# Patient Record
Sex: Female | Born: 1980 | Race: White | Hispanic: No | Smoking: Current every day smoker
Health system: Southern US, Community
[De-identification: ages and names within clinical notes are randomized; demographics above are authoritative.]

## PROBLEM LIST (undated history)

## (undated) DIAGNOSIS — I10 Essential (primary) hypertension: Secondary | ICD-10-CM

## (undated) DIAGNOSIS — E7800 Pure hypercholesterolemia, unspecified: Secondary | ICD-10-CM

## (undated) DIAGNOSIS — F4310 Post-traumatic stress disorder, unspecified: Secondary | ICD-10-CM

## (undated) DIAGNOSIS — E119 Type 2 diabetes mellitus without complications: Secondary | ICD-10-CM

## (undated) DIAGNOSIS — J302 Other seasonal allergic rhinitis: Secondary | ICD-10-CM

## (undated) DIAGNOSIS — J45909 Unspecified asthma, uncomplicated: Secondary | ICD-10-CM

## (undated) DIAGNOSIS — E079 Disorder of thyroid, unspecified: Secondary | ICD-10-CM

## (undated) HISTORY — PX: CHOLECYSTECTOMY: SHX55

## (undated) HISTORY — PX: ABDOMINAL HYSTERECTOMY: SHX81

## (undated) HISTORY — PX: THYROIDECTOMY: SHX17

---

## 2016-09-01 ENCOUNTER — Encounter (HOSPITAL_BASED_OUTPATIENT_CLINIC_OR_DEPARTMENT_OTHER): Payer: Self-pay | Admitting: Emergency Medicine

## 2016-09-01 ENCOUNTER — Emergency Department (HOSPITAL_BASED_OUTPATIENT_CLINIC_OR_DEPARTMENT_OTHER): Payer: Self-pay

## 2016-09-01 ENCOUNTER — Emergency Department (HOSPITAL_BASED_OUTPATIENT_CLINIC_OR_DEPARTMENT_OTHER)
Admission: EM | Admit: 2016-09-01 | Discharge: 2016-09-01 | Disposition: A | Discharge status: Home / Self Care | Payer: Self-pay | Attending: Emergency Medicine | Admitting: Emergency Medicine

## 2016-09-01 DIAGNOSIS — Z7984 Long term (current) use of oral hypoglycemic drugs: Secondary | ICD-10-CM | POA: Insufficient documentation

## 2016-09-01 DIAGNOSIS — R69 Illness, unspecified: Secondary | ICD-10-CM

## 2016-09-01 DIAGNOSIS — E119 Type 2 diabetes mellitus without complications: Secondary | ICD-10-CM | POA: Insufficient documentation

## 2016-09-01 DIAGNOSIS — F17200 Nicotine dependence, unspecified, uncomplicated: Secondary | ICD-10-CM | POA: Insufficient documentation

## 2016-09-01 DIAGNOSIS — J111 Influenza due to unidentified influenza virus with other respiratory manifestations: Secondary | ICD-10-CM | POA: Insufficient documentation

## 2016-09-01 DIAGNOSIS — Z79899 Other long term (current) drug therapy: Secondary | ICD-10-CM | POA: Insufficient documentation

## 2016-09-01 DIAGNOSIS — I10 Essential (primary) hypertension: Secondary | ICD-10-CM | POA: Insufficient documentation

## 2016-09-01 HISTORY — DX: Pure hypercholesterolemia, unspecified: E78.00

## 2016-09-01 HISTORY — DX: Other seasonal allergic rhinitis: J30.2

## 2016-09-01 HISTORY — DX: Post-traumatic stress disorder, unspecified: F43.10

## 2016-09-01 HISTORY — DX: Type 2 diabetes mellitus without complications: E11.9

## 2016-09-01 HISTORY — DX: Disorder of thyroid, unspecified: E07.9

## 2016-09-01 HISTORY — DX: Essential (primary) hypertension: I10

## 2016-09-01 LAB — URINALYSIS, ROUTINE W REFLEX MICROSCOPIC
Bilirubin Urine: NEGATIVE
Glucose, UA: NEGATIVE mg/dL
Hgb urine dipstick: NEGATIVE
Ketones, ur: NEGATIVE mg/dL
LEUKOCYTES UA: NEGATIVE
Nitrite: NEGATIVE
PROTEIN: NEGATIVE mg/dL
Specific Gravity, Urine: 1.014 (ref 1.005–1.030)
pH: 7 (ref 5.0–8.0)

## 2016-09-01 MED ORDER — ACETAMINOPHEN 500 MG PO TABS
1000.0000 mg | ORAL_TABLET | Freq: Once | ORAL | Status: AC
  Administered 2016-09-01: 1000 mg via ORAL
  Filled 2016-09-01: qty 2

## 2016-09-01 NOTE — Discharge Instructions (Signed)
Make sure that you drink at least six 8 ounce glasses of water each day. Get plenty of rest. Take Tylenol as directed for aches. See your primary care physician if not feeling better by next week

## 2016-09-01 NOTE — ED Triage Notes (Signed)
Patient states that she has a "small cough" that started last night. Reports that she now has generalized aches and lower back pain. Reports that she is "congested and stuffy"

## 2016-09-01 NOTE — ED Provider Notes (Signed)
[facility]   CSN: [id number] Arrival date & time: 09/01/16  2041  By signing my name below, I, Melissa Rollins, attest that this documentation has been prepared under the direction and in the presence of Doug SouSam Abbagail Scaff, MD. Electronically signed, Melissa Rollins, ED Scribe. 09/01/16. 10:36 PM.   History   Chief Complaint Chief Complaint  Patient presents with  . Fever  The history is provided by the patient and the spouse. No language interpreter was used.   HPI Comments: Melissa Rollins is a 35 y.o. female who presents to the Emergency Department complaining of generalized, constant, moderate body aches onset last night. She reports associated dry cough, lower back pain and nausea. Pt reports that her body aches are exacerbated with movement. The pt reports that tylenol provides mild relief for the pain, and she took her last dosage at 2:00PM today.The pt denies fever, dysuria, vomiting or sore throat.  She further reports a PMHx of DM and HLD. This pt has a past surgical history of a thyroidectomy and hysterectomy.   Past Medical History:  Diagnosis Date  . Diabetes mellitus without complication (HCC)   . Hypercholesterolemia   . Hypertension   . PTSD (post-traumatic stress disorder)   . Seasonal allergies   . Thyroid disease     There are no active problems to display for this patient.   Past Surgical History:  Procedure Laterality Date  . ABDOMINAL HYSTERECTOMY    . CHOLECYSTECTOMY    . THYROIDECTOMY      OB History    No data available       Home Medications    Prior to Admission medications   Medication Sig Start Date End Date Taking? Authorizing Provider  atorvastatin (LIPITOR) 20 MG tablet Take 20 mg by mouth daily.   Yes Historical Provider, MD  cetirizine (ZYRTEC) 10 MG tablet Take 10 mg by mouth daily.   Yes Historical Provider, MD  Fluoxetine HCl, PMDD, (SARAFEM) 10 MG TABS Take by mouth.   Yes Historical Provider, MD    hydrochlorothiazide (MICROZIDE) 12.5 MG capsule Take 12.5 mg by mouth daily.   Yes Historical Provider, MD  levothyroxine (SYNTHROID, LEVOTHROID) 200 MCG tablet Take 200 mcg by mouth daily before breakfast.   Yes Historical Provider, MD  lisinopril (PRINIVIL,ZESTRIL) 10 MG tablet Take 10 mg by mouth daily.   Yes Historical Provider, MD  meloxicam (MOBIC) 7.5 MG tablet Take 7.5 mg by mouth daily.   Yes Historical Provider, MD  metFORMIN (GLUCOPHAGE) 500 MG tablet Take by mouth 2 (two) times daily with a meal.   Yes Historical Provider, MD  traZODone (DESYREL) 100 MG tablet Take 100 mg by mouth at bedtime.   Yes Historical Provider, MD    Family History History reviewed. No pertinent family history.  Social History Social History  Substance Use Topics  . Smoking status: Current Every Day Smoker  . Smokeless tobacco: Never Used  . Alcohol use No     Allergies   Phenergan [promethazine hcl] and Motrin [ibuprofen]   Review of Systems Review of Systems  Constitutional: Negative for fever.  HENT: Negative.  Negative for sore throat.   Respiratory: Positive for cough.   Cardiovascular: Negative.   Gastrointestinal: Positive for nausea. Negative for vomiting.  Genitourinary: Negative for dysuria.  Musculoskeletal: Positive for back pain and myalgias (generalized).  Skin: Negative.   Neurological: Negative.   Psychiatric/Behavioral: Negative.      Physical Exam Updated Vital Signs BP 130/85 (BP Location: Left  Wrist)   Pulse 93   Temp 98.1 F (36.7 C) (Oral)   Resp 18   Ht 5\' 7"  (1.702 m)   Wt 295 lb (133.8 kg)   SpO2 98%   BMI 46.20 kg/m   Physical Exam  Constitutional: She appears well-developed and well-nourished.  HENT:  Head: Normocephalic and atraumatic.  Eyes: Conjunctivae are normal. Pupils are equal, round, and reactive to light.  Neck: Neck supple. No tracheal deviation present. No thyromegaly present.  Cardiovascular: Normal rate and regular rhythm.   No  murmur heard. Pulmonary/Chest: Effort normal and breath sounds normal.  Abdominal: Soft. Bowel sounds are normal. She exhibits no distension. There is no tenderness.  Musculoskeletal: Normal range of motion. She exhibits no edema or tenderness.  Neurological: She is alert. Coordination normal.  Skin: Skin is warm and dry. No rash noted.  Psychiatric: She has a normal mood and affect.  Nursing note and vitals reviewed.    ED Treatments / Results  DIAGNOSTIC STUDIES: Oxygen Saturation is 98% on RA, normal by my interpretation.    COORDINATION OF CARE: 10:36 PM Will order CXR, UA. Discussed treatment plan with pt at bedside and pt agreed to plan.    Labs (all labs ordered are listed, but only abnormal results are displayed) Labs Reviewed  URINALYSIS, ROUTINE W REFLEX MICROSCOPIC (NOT AT [facility])    EKG  EKG Interpretation None       Radiology Dg Chest 2 View  Result Date: 09/01/2016 CLINICAL DATA:  Body aches, chills and cough. EXAM: CHEST  2 VIEW COMPARISON:  None. FINDINGS: The heart size and mediastinal contours are within normal limits. Both lungs are clear. The visualized skeletal structures are unremarkable. IMPRESSION: No active cardiopulmonary disease. Electronically Signed   By: Ted Mcalpine M.D.   On: 09/01/2016 21:53    Procedures Procedures (including critical care time)  Medications Ordered in ED Medications - No data to display   Initial Impression / Assessment and Plan / ED Course  I have reviewed the triage vital signs and the nursing notes.  Pertinent labs & imaging results that were available during my care of the patient were reviewed by me and considered in my medical decision making (see chart for details).  Clinical Course    Chest x-ray reviewed by me Results for orders placed or performed during the hospital encounter of 09/01/16  Urinalysis, Routine w reflex microscopic (not at [facility])  Result Value Ref Range   Color, Urine YELLOW  YELLOW   APPearance CLEAR CLEAR   Specific Gravity, Urine 1.014 1.005 - 1.030   pH 7.0 5.0 - 8.0   Glucose, UA NEGATIVE NEGATIVE mg/dL   Hgb urine dipstick NEGATIVE NEGATIVE   Bilirubin Urine NEGATIVE NEGATIVE   Ketones, ur NEGATIVE NEGATIVE mg/dL   Protein, ur NEGATIVE NEGATIVE mg/dL   Nitrite NEGATIVE NEGATIVE   Leukocytes, UA NEGATIVE NEGATIVE   Dg Chest 2 View  Result Date: 09/01/2016 CLINICAL DATA:  Body aches, chills and cough. EXAM: CHEST  2 VIEW COMPARISON:  None. FINDINGS: The heart size and mediastinal contours are within normal limits. Both lungs are clear. The visualized skeletal structures are unremarkable. IMPRESSION: No active cardiopulmonary disease. Electronically Signed   By: Ted Mcalpine M.D.   On: 09/01/2016 21:53  Chest x-ray reviewed by me. Plan Tylenol for aches full with PMD as needed. Encourage oral hydration Final Clinical Impressions(s) / ED Diagnoses  Diagnosis influenza-like illness Final diagnoses:  None    New Prescriptions New Prescriptions  No medications on file     I personally performed the services described in this documentation, which was scribed in my presence. The recorded information has been reviewed and considered.     Doug Sou, MD 09/03/16 [id number]

## 2017-06-03 ENCOUNTER — Emergency Department (HOSPITAL_BASED_OUTPATIENT_CLINIC_OR_DEPARTMENT_OTHER)
Admission: EM | Admit: 2017-06-03 | Discharge: 2017-06-03 | Disposition: A | Discharge status: Home / Self Care | Payer: Self-pay | Attending: Emergency Medicine | Admitting: Emergency Medicine

## 2017-06-03 ENCOUNTER — Emergency Department (HOSPITAL_BASED_OUTPATIENT_CLINIC_OR_DEPARTMENT_OTHER): Payer: Self-pay

## 2017-06-03 ENCOUNTER — Encounter (HOSPITAL_BASED_OUTPATIENT_CLINIC_OR_DEPARTMENT_OTHER): Payer: Self-pay | Admitting: Emergency Medicine

## 2017-06-03 DIAGNOSIS — R0781 Pleurodynia: Secondary | ICD-10-CM

## 2017-06-03 DIAGNOSIS — J209 Acute bronchitis, unspecified: Secondary | ICD-10-CM | POA: Insufficient documentation

## 2017-06-03 DIAGNOSIS — I10 Essential (primary) hypertension: Secondary | ICD-10-CM | POA: Insufficient documentation

## 2017-06-03 DIAGNOSIS — Z7984 Long term (current) use of oral hypoglycemic drugs: Secondary | ICD-10-CM | POA: Insufficient documentation

## 2017-06-03 DIAGNOSIS — E119 Type 2 diabetes mellitus without complications: Secondary | ICD-10-CM | POA: Insufficient documentation

## 2017-06-03 DIAGNOSIS — Z79899 Other long term (current) drug therapy: Secondary | ICD-10-CM | POA: Insufficient documentation

## 2017-06-03 DIAGNOSIS — F17200 Nicotine dependence, unspecified, uncomplicated: Secondary | ICD-10-CM | POA: Insufficient documentation

## 2017-06-03 DIAGNOSIS — J40 Bronchitis, not specified as acute or chronic: Secondary | ICD-10-CM

## 2017-06-03 MED ORDER — ALBUTEROL SULFATE (2.5 MG/3ML) 0.083% IN NEBU
2.5000 mg | INHALATION_SOLUTION | Freq: Once | RESPIRATORY_TRACT | Status: AC
  Administered 2017-06-03: 2.5 mg via RESPIRATORY_TRACT
  Filled 2017-06-03: qty 3

## 2017-06-03 MED ORDER — HYDROCODONE-ACETAMINOPHEN 5-325 MG PO TABS
1.0000 | ORAL_TABLET | Freq: Once | ORAL | Status: AC
  Administered 2017-06-03: 1 via ORAL
  Filled 2017-06-03: qty 1

## 2017-06-03 MED ORDER — IPRATROPIUM-ALBUTEROL 0.5-2.5 (3) MG/3ML IN SOLN
3.0000 mL | Freq: Four times a day (QID) | RESPIRATORY_TRACT | Status: DC
  Administered 2017-06-03: 3 mL via RESPIRATORY_TRACT
  Filled 2017-06-03: qty 3

## 2017-06-03 MED ORDER — HYDROCODONE-HOMATROPINE 5-1.5 MG/5ML PO SYRP: 5.0000 mL | ORAL_SOLUTION | Freq: Four times a day (QID) | ORAL | 0 refills | Status: DC | PRN

## 2017-06-03 NOTE — Discharge Instructions (Signed)
Your xray did not show a definite rib fracture. It is possible that small fractures do not show on Xray initially. Continue all regular treatments given by your doctor. Use inhaler with spacer every 4hrs. Take pain/cough medication as prescribed. Use incentive spirometer every 1-2hrs. Follow up with family doctor in 2-3 days for recheck. Return if worsening symptoms.

## 2017-06-03 NOTE — ED Provider Notes (Signed)
[facility]   CSN: [id number] Arrival date & time: 06/03/17  1958  By signing my name below, I, Linna DarnerRussell Turner, attest that this documentation has been prepared under the direction and in the presence of Romario Tith, PA-C. Electronically Signed: Linna Darnerussell Turner, Scribe. 06/03/2017. 9:09 PM.  History   Chief Complaint Chief Complaint  Patient presents with  . Rib Injury   The history is provided by the patient. No language interpreter was used.    HPI Comments: Melissa Rollins is a 36 y.o. female with PMHx including DM and HTN who presents to the Emergency Department complaining of sudden onset, constant, severe left rib pain beginning around 6 PM this evening. Patient has had a persistent cough for several days and was diagnosed with bronchitis at her PCP's office on 7/5. Patient states she had a coughing spell this evening and heard a popping sensation in her left ribs accompanied by immediate pain. She was discharged from her PCP's office with Tessalon Perles, prednisone, and an albuterol inhaler which she has been taking without improvement of her cough. She did not take anything for her left rib pain prior to arrival. The pain is worse with palpation, coughing, and movement. She has no prior h/o left rib problems. Patient has a listed allergy to ibuprofen. She is compliant with her regular medications. Patient denies dyspnea, nausea, vomiting, or any other associated symptoms.  Past Medical History:  Diagnosis Date  . Diabetes mellitus without complication (HCC)   . Hypercholesterolemia   . Hypertension   . PTSD (post-traumatic stress disorder)   . Seasonal allergies   . Thyroid disease     There are no active problems to display for this patient.   Past Surgical History:  Procedure Laterality Date  . ABDOMINAL HYSTERECTOMY    . CHOLECYSTECTOMY    . THYROIDECTOMY      OB History    No data available       Home Medications    Prior to  Admission medications   Medication Sig Start Date End Date Taking? Authorizing Provider  gabapentin (NEURONTIN) 300 MG capsule Take 300 mg by mouth 3 (three) times daily.   Yes [provider]  predniSONE (DELTASONE) 10 MG tablet Take 40 mg by mouth daily with breakfast.   Yes [provider]  atorvastatin (LIPITOR) 20 MG tablet Take 20 mg by mouth daily.    [provider]  cetirizine (ZYRTEC) 10 MG tablet Take 10 mg by mouth daily.    [provider]  Fluoxetine HCl, PMDD, (SARAFEM) 10 MG TABS Take by mouth.    [provider]  hydrochlorothiazide (MICROZIDE) 12.5 MG capsule Take 12.5 mg by mouth daily.    [provider]  levothyroxine (SYNTHROID, LEVOTHROID) 200 MCG tablet Take 200 mcg by mouth daily before breakfast.    [provider]  lisinopril (PRINIVIL,ZESTRIL) 10 MG tablet Take 10 mg by mouth daily.    [provider]  meloxicam (MOBIC) 7.5 MG tablet Take 7.5 mg by mouth daily.    [provider]  metFORMIN (GLUCOPHAGE) 500 MG tablet Take by mouth 2 (two) times daily with a meal.    [provider]  traZODone (DESYREL) 100 MG tablet Take 100 mg by mouth at bedtime.    [provider]    Family History History reviewed. No pertinent family history.  Social History Social History  Substance Use Topics  . Smoking status: Current Every Day Smoker  . Smokeless tobacco: Never  Used  . Alcohol use No     Allergies   Phenergan [promethazine hcl]; Cefdinir; and Motrin [ibuprofen]   Review of Systems Review of Systems  Constitutional: Negative for chills and fever.  HENT: Positive for congestion and postnasal drip.   Respiratory: Positive for cough. Negative for shortness of breath.   Cardiovascular: Positive for chest pain (rib pain). Negative for leg swelling.  Gastrointestinal: Negative for nausea and vomiting.  All other systems reviewed and are negative.  Physical  Exam Updated Vital Signs BP 135/79 (BP Location: Left Arm)   Pulse (!) 104   Temp 98.6 F (37 C) (Oral)   Resp (!) 24   Ht 5\' 7"  (1.702 m)   Wt 299 lb (135.6 kg)   SpO2 96%   BMI 46.83 kg/m   Physical Exam  Constitutional: She is oriented to person, place, and time. She appears well-developed and well-nourished. No distress.  HENT:  Head: Normocephalic and atraumatic.  Eyes: Conjunctivae and EOM are normal.  Neck: Neck supple. No tracheal deviation present.  Cardiovascular: Normal rate, regular rhythm and normal heart sounds.   Pulmonary/Chest: Effort normal. No respiratory distress. She has wheezes. She has no rales. She exhibits tenderness.  End expiratory wheezes bilaterally. Tenderness to palpation over  the midaxillary and anterior lower ribs. No bruising, swelling, crepitus.  Musculoskeletal: Normal range of motion.  Neurological: She is alert and oriented to person, place, and time.  Skin: Skin is warm and dry.  Psychiatric: She has a normal mood and affect. Her behavior is normal.  Nursing note and vitals reviewed.  ED Treatments / Results  Labs (all labs ordered are listed, but only abnormal results are displayed) Labs Reviewed - No data to display  EKG  EKG Interpretation None       Radiology Dg Ribs Unilateral W/chest Left  Result Date: 06/03/2017 CLINICAL DATA:  Pain after coughing EXAM: LEFT RIBS AND CHEST - 3+ VIEW COMPARISON:  Chest radiograph September 01, 2016 FINDINGS: Frontal chest as well as oblique and cone-down lower rib images were obtained. Lungs are clear. Heart size and pulmonary vascularity are normal. No adenopathy. No evident pneumothorax or pleural effusion. No rib fractures. IMPRESSION: No evident rib fracture.  Lungs clear. Electronically Signed   By: Bretta Bang III M.D.   On: 06/03/2017 21:10    Procedures Procedures (including critical care time)  DIAGNOSTIC STUDIES: Oxygen Saturation is 96% on RA, adequate by my interpretation.     COORDINATION OF CARE: 9:02 PM Discussed treatment plan with pt at bedside and pt agreed to plan.  Medications Ordered in ED Medications  HYDROcodone-acetaminophen (NORCO/VICODIN) 5-325 MG per tablet 1 tablet (not administered)     Initial Impression / Assessment and Plan / ED Course  I have reviewed the triage vital signs and the nursing notes.  Pertinent labs & imaging results that were available during my care of the patient were reviewed by me and considered in my medical decision making (see chart for details).     Patient in emergency department with pain to the left ribs after coughing. States felt a pop, has been states he heard a pop as well. Chest x-ray is negative. Patient is coughing and wheezing on exam. Patient is not using her inhaler because "it doesn't help." Patient provided with a breathing treatment and a spacer. Also given incentive spirometer to use at home. Explained that occult hairline fracture may not show up right away. Patient's exam and imaging is difficult due to body habitus  as well. Patient does appear to be in severe pain, and even worse as she is coughing. I will treat her with Hycodan. She is otherwise afebrile, nontoxic-appearing. Oxygen saturation is normal. No respiratory distress.  Follow up with family doctor. Return precautions discussed.  Vitals:   06/03/17 2003 06/03/17 2006  BP:  135/79  Pulse:  (!) 104  Resp:  (!) 24  Temp:  98.6 F (37 C)  TempSrc:  Oral  SpO2:  96%  Weight: 135.6 kg (299 lb)   Height: 5\' 7"  (1.702 m)      Final Clinical Impressions(s) / ED Diagnoses   Final diagnoses:  Rib pain on left side  Bronchitis    New Prescriptions Current Discharge Medication List    START taking these medications   Details  HYDROcodone-homatropine (HYCODAN) 5-1.5 MG/5ML syrup Take 5 mLs by mouth every 6 (six) hours as needed for cough. Qty: 120 mL, Refills: 0       I personally performed the services described in this  documentation, which was scribed in my presence. The recorded information has been reviewed and is accurate.    Jaynie Crumble, PA-C 06/03/17 2148    Jacalyn Lefevre, MD 06/03/17 2208

## 2017-06-03 NOTE — ED Triage Notes (Signed)
Patient reports that she has had a cough and was treated at her drs for Bronchitis. She coughed earlier and felt her left ribs pop.

## 2018-03-31 ENCOUNTER — Encounter (HOSPITAL_BASED_OUTPATIENT_CLINIC_OR_DEPARTMENT_OTHER): Payer: Self-pay | Admitting: *Deleted

## 2018-03-31 ENCOUNTER — Other Ambulatory Visit: Payer: Self-pay

## 2018-03-31 ENCOUNTER — Emergency Department (HOSPITAL_BASED_OUTPATIENT_CLINIC_OR_DEPARTMENT_OTHER): Payer: Self-pay

## 2018-03-31 DIAGNOSIS — Z79899 Other long term (current) drug therapy: Secondary | ICD-10-CM | POA: Insufficient documentation

## 2018-03-31 DIAGNOSIS — R2243 Localized swelling, mass and lump, lower limb, bilateral: Secondary | ICD-10-CM | POA: Insufficient documentation

## 2018-03-31 DIAGNOSIS — R0781 Pleurodynia: Secondary | ICD-10-CM | POA: Insufficient documentation

## 2018-03-31 DIAGNOSIS — E119 Type 2 diabetes mellitus without complications: Secondary | ICD-10-CM | POA: Insufficient documentation

## 2018-03-31 DIAGNOSIS — I10 Essential (primary) hypertension: Secondary | ICD-10-CM | POA: Insufficient documentation

## 2018-03-31 DIAGNOSIS — M549 Dorsalgia, unspecified: Secondary | ICD-10-CM | POA: Insufficient documentation

## 2018-03-31 DIAGNOSIS — Z7984 Long term (current) use of oral hypoglycemic drugs: Secondary | ICD-10-CM | POA: Insufficient documentation

## 2018-03-31 DIAGNOSIS — M25512 Pain in left shoulder: Secondary | ICD-10-CM | POA: Insufficient documentation

## 2018-03-31 DIAGNOSIS — F17210 Nicotine dependence, cigarettes, uncomplicated: Secondary | ICD-10-CM | POA: Insufficient documentation

## 2018-03-31 DIAGNOSIS — J45909 Unspecified asthma, uncomplicated: Secondary | ICD-10-CM | POA: Insufficient documentation

## 2018-03-31 DIAGNOSIS — R05 Cough: Secondary | ICD-10-CM | POA: Insufficient documentation

## 2018-03-31 DIAGNOSIS — E079 Disorder of thyroid, unspecified: Secondary | ICD-10-CM | POA: Insufficient documentation

## 2018-03-31 NOTE — ED Triage Notes (Signed)
Pt reports productive cough x 4 days. C/o pain with cough and breathing in her back behind left shoulder last night. Pain worse with cough, deep breath and cough

## 2018-04-01 ENCOUNTER — Encounter (HOSPITAL_BASED_OUTPATIENT_CLINIC_OR_DEPARTMENT_OTHER): Payer: Self-pay

## 2018-04-01 ENCOUNTER — Emergency Department (HOSPITAL_BASED_OUTPATIENT_CLINIC_OR_DEPARTMENT_OTHER): Payer: Self-pay

## 2018-04-01 ENCOUNTER — Emergency Department (HOSPITAL_BASED_OUTPATIENT_CLINIC_OR_DEPARTMENT_OTHER)
Admission: EM | Admit: 2018-04-01 | Discharge: 2018-04-01 | Disposition: A | Discharge status: Home / Self Care | Payer: Self-pay | Attending: Emergency Medicine | Admitting: Emergency Medicine

## 2018-04-01 DIAGNOSIS — R059 Cough, unspecified: Secondary | ICD-10-CM

## 2018-04-01 DIAGNOSIS — R05 Cough: Secondary | ICD-10-CM

## 2018-04-01 DIAGNOSIS — R0781 Pleurodynia: Secondary | ICD-10-CM

## 2018-04-01 DIAGNOSIS — R600 Localized edema: Secondary | ICD-10-CM

## 2018-04-01 HISTORY — DX: Unspecified asthma, uncomplicated: J45.909

## 2018-04-01 LAB — CBC WITH DIFFERENTIAL/PLATELET
Basophils Absolute: 0 10*3/uL (ref 0.0–0.1)
Basophils Relative: 0 %
EOS ABS: 0.5 10*3/uL (ref 0.0–0.7)
EOS PCT: 4 %
HCT: 42.6 % (ref 36.0–46.0)
Hemoglobin: 14.8 g/dL (ref 12.0–15.0)
Lymphocytes Relative: 24 %
Lymphs Abs: 3 10*3/uL (ref 0.7–4.0)
MCH: 30.8 pg (ref 26.0–34.0)
MCHC: 34.7 g/dL (ref 30.0–36.0)
MCV: 88.8 fL (ref 78.0–100.0)
Monocytes Absolute: 0.8 10*3/uL (ref 0.1–1.0)
Monocytes Relative: 7 %
Neutro Abs: 8.1 10*3/uL (ref 1.7–7.7)
Neutrophils Relative %: 65 %
PLATELETS: 207 10*3/uL (ref 150–400)
RBC: 4.8 MIL/uL (ref 3.87–5.11)
RDW: 16.1 % — AB (ref 11.5–15.5)
WBC: 12.4 10*3/uL — AB (ref 4.0–10.5)

## 2018-04-01 LAB — BASIC METABOLIC PANEL
Anion gap: 10 (ref 5–15)
BUN: 8 mg/dL (ref 6–20)
CALCIUM: 9 mg/dL (ref 8.9–10.3)
CO2: 24 mmol/L (ref 22–32)
CREATININE: 0.72 mg/dL (ref 0.44–1.00)
Chloride: 102 mmol/L (ref 101–111)
Glucose, Bld: 199 mg/dL — ABNORMAL HIGH (ref 65–99)
Potassium: 3.3 mmol/L — ABNORMAL LOW (ref 3.5–5.1)
Sodium: 136 mmol/L (ref 135–145)

## 2018-04-01 LAB — BRAIN NATRIURETIC PEPTIDE: B NATRIURETIC PEPTIDE 5: 35.6 pg/mL (ref 0.0–100.0)

## 2018-04-01 LAB — D-DIMER, QUANTITATIVE (NOT AT ARMC): D DIMER QUANT: 0.52 ug{FEU}/mL — AB (ref 0.00–0.50)

## 2018-04-01 MED ORDER — FUROSEMIDE 20 MG PO TABS: 20.0000 mg | ORAL_TABLET | Freq: Every day | ORAL | 0 refills | Status: DC

## 2018-04-01 MED ORDER — IOPAMIDOL (ISOVUE-370) INJECTION 76%
100.0000 mL | Freq: Once | INTRAVENOUS | Status: AC | PRN
  Administered 2018-04-01: 100 mL via INTRAVENOUS

## 2018-04-01 MED ORDER — OXYCODONE-ACETAMINOPHEN 5-325 MG PO TABS
1.0000 | ORAL_TABLET | Freq: Once | ORAL | Status: AC
  Administered 2018-04-01: 1 via ORAL
  Filled 2018-04-01: qty 1

## 2018-04-01 MED ORDER — NAPROXEN 500 MG PO TABS: 500.0000 mg | ORAL_TABLET | Freq: Two times a day (BID) | ORAL | 0 refills | Status: DC

## 2018-04-01 NOTE — ED Provider Notes (Signed)
[facility] EMERGENCY DEPARTMENT Provider Note   CSN: [id number] Arrival date & time: 03/31/18  2252     History   Chief Complaint Chief Complaint  Patient presents with  . Cough    HPI Melissa Rollins is a 37 y.o. female.  HPI  This is a 37 year old female with a history of asthma, diabetes, hypertension who presents with cough and left sided back and shoulder pain.  Patient reports onset of symptoms 4 days ago.  Denies fevers or productive cough.  Reports that she has sharp left-sided back pain that is worse with coughing and breathing.  She took Flexeril and ibuprofen without any relief.  Rates her pain at 8 out of 10.  She has noted some increased lower extremity swelling.  She has been out of her Lasix for 1 week.  Denies any injury or falls.  Denies chest pain or shortness of breath.  Past Medical History:  Diagnosis Date  . Asthma   . Diabetes mellitus without complication (HCC)   . Hypercholesterolemia   . Hypertension   . PTSD (post-traumatic stress disorder)   . Seasonal allergies   . Thyroid disease     There are no active problems to display for this patient.   Past Surgical History:  Procedure Laterality Date  . ABDOMINAL HYSTERECTOMY    . CHOLECYSTECTOMY    . THYROIDECTOMY       OB History   None      Home Medications    Prior to Admission medications   Medication Sig Start Date End Date Taking? Authorizing Provider  Omega-3 Fatty Acids (FISH OIL) 1000 MG CPDR Take 2 capsules by mouth daily.   Yes [provider]  atorvastatin (LIPITOR) 20 MG tablet Take 20 mg by mouth daily.    [provider]  cetirizine (ZYRTEC) 10 MG tablet Take 10 mg by mouth daily.    [provider]  Fluoxetine HCl, PMDD, (SARAFEM) 10 MG TABS Take by mouth.    [provider]  furosemide (LASIX) 20 MG tablet Take 1 tablet (20 mg total) by mouth daily. 04/01/18   Horton, Mayer Masker, MD  gabapentin (NEURONTIN) 300 MG capsule Take 300  mg by mouth 3 (three) times daily.    [provider]  hydrochlorothiazide (MICROZIDE) 12.5 MG capsule Take 12.5 mg by mouth daily.    [provider]  HYDROcodone-homatropine (HYCODAN) 5-1.5 MG/5ML syrup Take 5 mLs by mouth every 6 (six) hours as needed for cough. 06/03/17   Kirichenko, Lemont Fillers, PA-C  levothyroxine (SYNTHROID, LEVOTHROID) 200 MCG tablet Take 200 mcg by mouth daily before breakfast.    [provider]  lisinopril (PRINIVIL,ZESTRIL) 10 MG tablet Take 10 mg by mouth daily.    [provider]  meloxicam (MOBIC) 7.5 MG tablet Take 7.5 mg by mouth daily.    [provider]  metFORMIN (GLUCOPHAGE) 500 MG tablet Take by mouth 2 (two) times daily with a meal.    [provider]  naproxen (NAPROSYN) 500 MG tablet Take 1 tablet (500 mg total) by mouth 2 (two) times daily. 04/01/18   Horton, Mayer Masker, MD  predniSONE (DELTASONE) 10 MG tablet Take 40 mg by mouth daily with breakfast.    [provider]  traZODone (DESYREL) 100 MG tablet Take 100 mg by mouth at bedtime.    [provider]    Family History No family history on file.  Social History Social History   Tobacco Use  . Smoking status: Current  Every Day Smoker    Types: Cigarettes  . Smokeless tobacco: Never Used  Substance Use Topics  . Alcohol use: No  . Drug use: No     Allergies   Phenergan [promethazine hcl]; Cefdinir; and Motrin [ibuprofen]   Review of Systems Review of Systems  Constitutional: Negative for fever.  Respiratory: Positive for cough. Negative for shortness of breath.   Cardiovascular: Negative for chest pain.  Gastrointestinal: Negative for abdominal pain, nausea and vomiting.  Genitourinary: Negative for dysuria.  Musculoskeletal:       Left posterior shoulder pain  All other systems reviewed and are negative.    Physical Exam Updated Vital Signs BP 115/74 (BP Location: Right Arm)   Pulse 74   Temp 98 F (36.7 C)  (Oral)   Resp 16   SpO2 95%   Physical Exam  Constitutional: She is oriented to person, place, and time. She appears well-developed and well-nourished.  Morbidly obese, no acute distress  HENT:  Head: Normocephalic and atraumatic.  Eyes: Pupils are equal, round, and reactive to light.  Cardiovascular: Normal rate, regular rhythm and normal heart sounds.  No murmur heard. Pulmonary/Chest: Effort normal and breath sounds normal. No respiratory distress. She has no wheezes.  Abdominal: Soft. Bowel sounds are normal. There is no tenderness.  Musculoskeletal:  Tenderness palpation left posterior shoulder at the scapula, no overlying skin changes 2+ pitting edema bilateral lower extremities  Neurological: She is alert and oriented to person, place, and time.  Skin: Skin is warm and dry.  Psychiatric: She has a normal mood and affect.  Nursing note and vitals reviewed.    ED Treatments / Results  Labs (all labs ordered are listed, but only abnormal results are displayed) Labs Reviewed  CBC WITH DIFFERENTIAL/PLATELET - Abnormal; Notable for the following components:      Result Value   WBC 12.4 (*)    RDW 16.1 (*)    All other components within normal limits  BASIC METABOLIC PANEL - Abnormal; Notable for the following components:   Potassium 3.3 (*)    Glucose, Bld 199 (*)    All other components within normal limits  D-DIMER, QUANTITATIVE (NOT AT [facility]) - Abnormal; Notable for the following components:   D-Dimer, Quant 0.52 (*)    All other components within normal limits  BRAIN NATRIURETIC PEPTIDE    EKG None  Radiology Dg Chest 2 View  Result Date: 04/01/2018 CLINICAL DATA:  Left scapular pain since yesterday. EXAM: CHEST - 2 VIEW COMPARISON:  06/03/2017 FINDINGS: Borderline cardiomegaly with mild central vascular congestion. No acute pulmonary consolidation or pneumothorax. No effusion. No acute osseous abnormality. IMPRESSION: Borderline cardiomegaly with mild central  vascular congestion. Electronically Signed   By: Tollie Eth M.D.   On: 04/01/2018 00:14   Ct Angio Chest Pe W And/or Wo Contrast  Result Date: 04/01/2018 CLINICAL DATA:  37 year old female seen with elevated D-dimer. EXAM: CT ANGIOGRAPHY CHEST WITH CONTRAST TECHNIQUE: Multidetector CT imaging of the chest was performed using the standard protocol during bolus administration of intravenous contrast. Multiplanar CT image reconstructions and MIPs were obtained to evaluate the vascular anatomy. CONTRAST:  ISOVUE-370 IOPAMIDOL (ISOVUE-370) INJECTION 76% COMPARISON:  Chest radiograph dated 03/31/2018 FINDINGS: Cardiovascular: Top-normal cardiac size. No pericardial effusion. The thoracic aorta is unremarkable. The origins of the great vessels of the aortic arch are patent. There is no CT evidence of pulmonary embolism. Mediastinum/Nodes: No hilar or mediastinal adenopathy. The esophagus is grossly unremarkable. No mediastinal fluid collection. Lungs/Pleura: Minimal  atelectatic changes. There is no focal consolidation, pleural effusion, or pneumothorax. The central airways are patent. Upper Abdomen: Fatty liver. Cholecystectomy. A 2 cm left adrenal adenoma. Musculoskeletal: No chest wall abnormality. No acute or significant osseous findings. Review of the MIP images confirms the above findings. IMPRESSION: No acute intrathoracic pathology. No CT evidence of pulmonary embolism. Electronically Signed   By: Elgie Collard M.D.   On: 04/01/2018 04:19    Procedures Procedures (including critical care time)  Medications Ordered in ED Medications  oxyCODONE-acetaminophen (PERCOCET/ROXICET) 5-325 MG per tablet 1 tablet (1 tablet Oral Given 04/01/18 0152)  iopamidol (ISOVUE-370) 76 % injection 100 mL (100 mLs Intravenous Contrast Given 04/01/18 0351)     Initial Impression / Assessment and Plan / ED Course  I have reviewed the triage vital signs and the nursing notes.  Pertinent labs & imaging results that  were available during my care of the patient were reviewed by me and considered in my medical decision making (see chart for details).     Patient presents with cough and pleuritic pain in the back just adjacent to the scapula.  She is obese but nontoxic-appearing.  No respiratory distress.  Vital signs are reassuring.  She also has some lower extremity swelling.  Considerations include volume overload, pneumonia, PE, musculoskeletal pain.  Patient was given pain medication.  Initial work-up notable for mild leukocytosis to 12.4.  Chest x-ray shows evidence of some volume overload.  D-dimer is mildly elevated at 0.52.  CT scan obtained and reassuring.  Suspect musculoskeletal etiology for discomfort.  Patient will be started back on her Lasix as she appears volume overloaded.  She will need to follow-up with her primary physician.  After history, exam, and medical workup I feel the patient has been appropriately medically screened and is safe for discharge home. Pertinent diagnoses were discussed with the patient. Patient was given return precautions.  Final Clinical Impressions(s) / ED Diagnoses   Final diagnoses:  Cough  Pleuritic pain  Bilateral lower extremity edema    ED Discharge Orders        Ordered    furosemide (LASIX) 20 MG tablet  Daily     04/01/18 0430    naproxen (NAPROSYN) 500 MG tablet  2 times daily     04/01/18 0430       Horton, Mayer Masker, MD 04/01/18 [phone]

## 2018-04-01 NOTE — Discharge Instructions (Addendum)
You were seen today for cough, left shoulder pain, and increased lower extremity edema.  Your CT scan does not show any evidence of pulmonary embolism.  You do appear slightly volume overloaded.  You will be given a refill of your Lasix.  Start taking as soon as possible.  Take naproxen as needed for pain.

## 2018-04-01 NOTE — ED Notes (Signed)
Patient transported to CT 

## 2018-04-01 NOTE — ED Notes (Signed)
Patient returned from CT; no acute distress noted.

## 2018-05-20 ENCOUNTER — Other Ambulatory Visit: Payer: Self-pay

## 2018-05-20 ENCOUNTER — Encounter (HOSPITAL_BASED_OUTPATIENT_CLINIC_OR_DEPARTMENT_OTHER): Payer: Self-pay | Admitting: Emergency Medicine

## 2018-05-20 ENCOUNTER — Emergency Department (HOSPITAL_BASED_OUTPATIENT_CLINIC_OR_DEPARTMENT_OTHER)
Admission: EM | Admit: 2018-05-20 | Discharge: 2018-05-20 | Disposition: A | Discharge status: Home / Self Care | Payer: Self-pay | Attending: Emergency Medicine | Admitting: Emergency Medicine

## 2018-05-20 DIAGNOSIS — E119 Type 2 diabetes mellitus without complications: Secondary | ICD-10-CM | POA: Insufficient documentation

## 2018-05-20 DIAGNOSIS — F17210 Nicotine dependence, cigarettes, uncomplicated: Secondary | ICD-10-CM | POA: Insufficient documentation

## 2018-05-20 DIAGNOSIS — J45909 Unspecified asthma, uncomplicated: Secondary | ICD-10-CM | POA: Insufficient documentation

## 2018-05-20 DIAGNOSIS — Z7984 Long term (current) use of oral hypoglycemic drugs: Secondary | ICD-10-CM | POA: Insufficient documentation

## 2018-05-20 DIAGNOSIS — Z79899 Other long term (current) drug therapy: Secondary | ICD-10-CM | POA: Insufficient documentation

## 2018-05-20 DIAGNOSIS — I10 Essential (primary) hypertension: Secondary | ICD-10-CM | POA: Insufficient documentation

## 2018-05-20 DIAGNOSIS — M25511 Pain in right shoulder: Secondary | ICD-10-CM | POA: Insufficient documentation

## 2018-05-20 DIAGNOSIS — G8929 Other chronic pain: Secondary | ICD-10-CM | POA: Insufficient documentation

## 2018-05-20 MED ORDER — HYDROCODONE-ACETAMINOPHEN 5-325 MG PO TABS
1.0000 | ORAL_TABLET | Freq: Once | ORAL | Status: AC
  Administered 2018-05-20: 1 via ORAL
  Filled 2018-05-20: qty 1

## 2018-05-20 MED ORDER — HYDROCODONE-ACETAMINOPHEN 5-325 MG PO TABS: 1.0000 | ORAL_TABLET | Freq: Four times a day (QID) | ORAL | 0 refills | Status: DC | PRN

## 2018-05-20 NOTE — Discharge Instructions (Addendum)
You were seen today for shoulder pain.  This seems to be an acute on chronic issue for you.  There are some features that suggest nerve impingement; however, this also could be related to chronic arthritis or rotator cuff injury.  Follow-up with sports medicine for further evaluation.  You will be given a short course of pain medication.  Continue your Mobic as well.

## 2018-05-20 NOTE — ED Provider Notes (Signed)
[facility] EMERGENCY DEPARTMENT Provider Note   CSN: [id number] Arrival date & time: 05/20/18  0100     History   Chief Complaint Chief Complaint  Patient presents with  . Shoulder Pain    HPI Melissa Rollins is a 37 y.o. female.  HPI  This is a 37 year old female with history of obesity, diabetes, hypertension, hypercholesterolemia who presents with right shoulder pain.  Patient reports a history of the same.  She states she gets "flares."  She reports that normally she takes her Tylenol and muscle relaxant and "it will get better in a day."  However, she states she has had pain over the last 2 to 3 days with worsening pain.  She describes pain in the right upper shoulder neck area.  Worse with range of motion of the shoulder and neck turning.  She denies any new injury.  At times the pain radiates down her arm.  She reports that when she ranges her right shoulder it feels like "my bone is grinding."  She at times has tingling in her third and fourth digits of the right hand.  She denies any weakness of the hand.  Currently she rates her pain at 7 out of 10.  She also chronically takes Mobic but states that that does not seem to help.  She has not seen a specialist.  Past Medical History:  Diagnosis Date  . Asthma   . Diabetes mellitus without complication (HCC)   . Hypercholesterolemia   . Hypertension   . PTSD (post-traumatic stress disorder)   . Seasonal allergies   . Thyroid disease     There are no active problems to display for this patient.   Past Surgical History:  Procedure Laterality Date  . ABDOMINAL HYSTERECTOMY    . CHOLECYSTECTOMY    . THYROIDECTOMY       OB History   None      Home Medications    Prior to Admission medications   Medication Sig Start Date End Date Taking? Authorizing Provider  atorvastatin (LIPITOR) 20 MG tablet Take 20 mg by mouth daily.    [provider]  cetirizine (ZYRTEC) 10 MG tablet Take 10 mg by mouth  daily.    [provider]  Fluoxetine HCl, PMDD, (SARAFEM) 10 MG TABS Take by mouth.    [provider]  furosemide (LASIX) 20 MG tablet Take 1 tablet (20 mg total) by mouth daily. 04/01/18   Ebrahim Deremer, Mayer Masker, MD  gabapentin (NEURONTIN) 300 MG capsule Take 300 mg by mouth 3 (three) times daily.    [provider]  hydrochlorothiazide (MICROZIDE) 12.5 MG capsule Take 12.5 mg by mouth daily.    [provider]  HYDROcodone-acetaminophen (NORCO/VICODIN) 5-325 MG tablet Take 1 tablet by mouth every 6 (six) hours as needed. 05/20/18   Edger Husain, Mayer Masker, MD  HYDROcodone-homatropine (HYCODAN) 5-1.5 MG/5ML syrup Take 5 mLs by mouth every 6 (six) hours as needed for cough. 06/03/17   Kirichenko, Lemont Fillers, PA-C  levothyroxine (SYNTHROID, LEVOTHROID) 200 MCG tablet Take 200 mcg by mouth daily before breakfast.    [provider]  lisinopril (PRINIVIL,ZESTRIL) 10 MG tablet Take 10 mg by mouth daily.    [provider]  meloxicam (MOBIC) 7.5 MG tablet Take 7.5 mg by mouth daily.    [provider]  metFORMIN (GLUCOPHAGE) 500 MG tablet Take by mouth 2 (two) times daily with a meal.    [provider]  naproxen (NAPROSYN) 500 MG tablet Take 1  tablet (500 mg total) by mouth 2 (two) times daily. 04/01/18   Ladashia Demarinis, Mayer Masker, MD  Omega-3 Fatty Acids (FISH OIL) 1000 MG CPDR Take 2 capsules by mouth daily.    [provider]  predniSONE (DELTASONE) 10 MG tablet Take 40 mg by mouth daily with breakfast.    [provider]  traZODone (DESYREL) 100 MG tablet Take 100 mg by mouth at bedtime.    [provider]    Family History No family history on file.  Social History Social History   Tobacco Use  . Smoking status: Current Every Day Smoker    Types: Cigarettes  . Smokeless tobacco: Never Used  Substance Use Topics  . Alcohol use: No  . Drug use: No     Allergies   Phenergan [promethazine hcl]; Cefdinir; and  Motrin [ibuprofen]   Review of Systems Review of Systems  Constitutional: Negative for fever.  Musculoskeletal:       Right shoulder pain  Neurological: Negative for weakness and numbness.  All other systems reviewed and are negative.    Physical Exam Updated Vital Signs There were no vitals taken for this visit.  Physical Exam  Constitutional: She is oriented to person, place, and time. She appears well-developed and well-nourished.  HENT:  Head: Normocephalic and atraumatic.  Neck: Normal range of motion. Neck supple.  No midline C-spine tenderness palpation, step-off, or deformity  Cardiovascular: Normal rate and regular rhythm.  Pulmonary/Chest: Effort normal. No respiratory distress.  Musculoskeletal:  Focused examination of the right shoulder limited by body habitus, no obvious deformities or swelling, tenderness to palpation of the brachial plexus in the right AC joint, patient with normal abduction of the shoulder, pain with passive and active range of motion of the shoulder, normal range of motion of the elbow, external and internal rotation and strength of the shoulder intact  Neurological: She is alert and oriented to person, place, and time.  5 out of 5 grip, deltoid, biceps, and triceps strength bilaterally  Skin: Skin is warm and dry.  Psychiatric: She has a normal mood and affect.  Nursing note and vitals reviewed.    ED Treatments / Results  Labs (all labs ordered are listed, but only abnormal results are displayed) Labs Reviewed - No data to display  EKG None  Radiology No results found.  Procedures Procedures (including critical care time)  Medications Ordered in ED Medications  HYDROcodone-acetaminophen (NORCO/VICODIN) 5-325 MG per tablet 1 tablet (has no administration in time range)     Initial Impression / Assessment and Plan / ED Course  I have reviewed the triage vital signs and the nursing notes.  Pertinent labs & imaging results that  were available during my care of the patient were reviewed by me and considered in my medical decision making (see chart for details).     Patient presents with right shoulder pain.  Pain is acute on chronic.  She has pain with range of motion but preserved range of motion.  There are features of her pain that do suggest a radicular component and some of her pain is elicited with palpation of the brachial plexus.  She also reports some tingling in her fingers in the C7/C8 nerve distribution.  She has intact strength.  At this time I do not feel that imaging would be high yield as I doubt acute fracture.  Patient reports compliance with her home medications including Mobic and a muscle relaxant.  She is not a good candidate for  prednisone given her history of diabetes.  Patient will be given a short course of Norco.  The [place] database was reviewed without any recent prescriptions.  Last prescription was in December 2018.  Recommend close follow-up with sports medicine for focused evaluation.  Patient stated understanding.  After history, exam, and medical workup I feel the patient has been appropriately medically screened and is safe for discharge home. Pertinent diagnoses were discussed with the patient. Patient was given return precautions.   Final Clinical Impressions(s) / ED Diagnoses   Final diagnoses:  Chronic right shoulder pain    ED Discharge Orders        Ordered    HYDROcodone-acetaminophen (NORCO/VICODIN) 5-325 MG tablet  Every 6 hours PRN     05/20/18 0136       Shon BatonHorton, Ichiro Chesnut F, MD 05/20/18 [phone]

## 2018-05-20 NOTE — ED Triage Notes (Addendum)
C/o chronic R shoulder pain with intermittent "flare ups".  Current flare up started 2 days ago. Pt reports pain radiates to neck and elbow. Describes pain as "feels like someone is crushing a bone". Limited ROM. Pt reports having tried muscle relaxers at home without relief.

## 2018-07-11 ENCOUNTER — Emergency Department (HOSPITAL_BASED_OUTPATIENT_CLINIC_OR_DEPARTMENT_OTHER): Payer: Self-pay

## 2018-07-11 ENCOUNTER — Emergency Department (HOSPITAL_BASED_OUTPATIENT_CLINIC_OR_DEPARTMENT_OTHER)
Admission: EM | Admit: 2018-07-11 | Discharge: 2018-07-12 | Disposition: A | Discharge status: Home / Self Care | Payer: Self-pay | Attending: Emergency Medicine | Admitting: Emergency Medicine

## 2018-07-11 ENCOUNTER — Other Ambulatory Visit: Payer: Self-pay

## 2018-07-11 ENCOUNTER — Encounter (HOSPITAL_BASED_OUTPATIENT_CLINIC_OR_DEPARTMENT_OTHER): Payer: Self-pay

## 2018-07-11 DIAGNOSIS — Z79899 Other long term (current) drug therapy: Secondary | ICD-10-CM | POA: Insufficient documentation

## 2018-07-11 DIAGNOSIS — E119 Type 2 diabetes mellitus without complications: Secondary | ICD-10-CM | POA: Insufficient documentation

## 2018-07-11 DIAGNOSIS — K5732 Diverticulitis of large intestine without perforation or abscess without bleeding: Secondary | ICD-10-CM | POA: Insufficient documentation

## 2018-07-11 DIAGNOSIS — K5792 Diverticulitis of intestine, part unspecified, without perforation or abscess without bleeding: Secondary | ICD-10-CM

## 2018-07-11 DIAGNOSIS — R1032 Left lower quadrant pain: Secondary | ICD-10-CM

## 2018-07-11 DIAGNOSIS — Z7982 Long term (current) use of aspirin: Secondary | ICD-10-CM | POA: Insufficient documentation

## 2018-07-11 DIAGNOSIS — J45909 Unspecified asthma, uncomplicated: Secondary | ICD-10-CM | POA: Insufficient documentation

## 2018-07-11 DIAGNOSIS — I10 Essential (primary) hypertension: Secondary | ICD-10-CM | POA: Insufficient documentation

## 2018-07-11 DIAGNOSIS — F17210 Nicotine dependence, cigarettes, uncomplicated: Secondary | ICD-10-CM | POA: Insufficient documentation

## 2018-07-11 LAB — URINALYSIS, ROUTINE W REFLEX MICROSCOPIC
Bilirubin Urine: NEGATIVE
Glucose, UA: NEGATIVE mg/dL
HGB URINE DIPSTICK: NEGATIVE
Ketones, ur: NEGATIVE mg/dL
Leukocytes, UA: NEGATIVE
NITRITE: NEGATIVE
Protein, ur: NEGATIVE mg/dL
Specific Gravity, Urine: 1.02 (ref 1.005–1.030)
pH: 6 (ref 5.0–8.0)

## 2018-07-11 LAB — COMPREHENSIVE METABOLIC PANEL
ALBUMIN: 3.8 g/dL (ref 3.5–5.0)
ALK PHOS: 69 U/L (ref 38–126)
ALT: 17 U/L (ref 0–44)
AST: 28 U/L (ref 15–41)
Anion gap: 10 (ref 5–15)
BUN: 11 mg/dL (ref 6–20)
CO2: 24 mmol/L (ref 22–32)
CREATININE: 0.67 mg/dL (ref 0.44–1.00)
Calcium: 8.8 mg/dL — ABNORMAL LOW (ref 8.9–10.3)
Chloride: 101 mmol/L (ref 98–111)
GFR calc Af Amer: >60 mL/min (ref >60)
GFR calc non Af Amer: >60 mL/min (ref >60)
GLUCOSE: 162 mg/dL — AB (ref 70–99)
Potassium: 3.6 mmol/L (ref 3.5–5.1)
SODIUM: 135 mmol/L (ref 135–145)
Total Bilirubin: 0.4 mg/dL (ref 0.3–1.2)
Total Protein: 7.1 g/dL (ref 6.5–8.1)

## 2018-07-11 LAB — CBC
HCT: 42.7 % (ref 36.0–46.0)
Hemoglobin: 14.9 g/dL (ref 12.0–15.0)
MCH: 30.7 pg (ref 26.0–34.0)
MCHC: 34.9 g/dL (ref 30.0–36.0)
MCV: 87.9 fL (ref 78.0–100.0)
PLATELETS: 234 10*3/uL (ref 150–400)
RBC: 4.86 MIL/uL (ref 3.87–5.11)
RDW: 15.5 % (ref 11.5–15.5)
WBC: 14.2 10*3/uL — ABNORMAL HIGH (ref 4.0–10.5)

## 2018-07-11 LAB — LIPASE, BLOOD: Lipase: 38 U/L (ref 11–51)

## 2018-07-11 LAB — I-STAT CG4 LACTIC ACID, ED: LACTIC ACID, VENOUS: 1.68 mmol/L (ref 0.5–1.9)

## 2018-07-11 MED ORDER — MORPHINE SULFATE (PF) 4 MG/ML IV SOLN
4.0000 mg | Freq: Once | INTRAVENOUS | Status: AC
  Administered 2018-07-11: 4 mg via INTRAVENOUS
  Filled 2018-07-11: qty 1

## 2018-07-11 MED ORDER — IOPAMIDOL (ISOVUE-300) INJECTION 61%
125.0000 mL | Freq: Once | INTRAVENOUS | Status: AC | PRN
  Administered 2018-07-11: 125 mL via INTRAVENOUS

## 2018-07-11 MED ORDER — ONDANSETRON HCL 4 MG/2ML IJ SOLN
4.0000 mg | Freq: Once | INTRAMUSCULAR | Status: AC | PRN
  Administered 2018-07-11: 4 mg via INTRAVENOUS
  Filled 2018-07-11: qty 2

## 2018-07-11 MED ORDER — SODIUM CHLORIDE 0.9 % IV BOLUS
1000.0000 mL | Freq: Once | INTRAVENOUS | Status: AC
  Administered 2018-07-11: 1000 mL via INTRAVENOUS

## 2018-07-11 NOTE — ED Provider Notes (Signed)
[facility] EMERGENCY DEPARTMENT Provider Note   CSN: [id number] Arrival date & time: 07/11/18  2054     History   Chief Complaint Chief Complaint  Patient presents with  . Abdominal Pain    HPI Melissa Rollins is a 37 y.o. female.  The history is provided by the patient, a relative and medical records. No language interpreter was used.  Abdominal Pain   This is a new problem. The current episode started yesterday. The problem occurs constantly. The problem has been gradually worsening. The pain is associated with an unknown factor. The pain is located in the LUQ and LLQ. The quality of the pain is aching, sharp and cramping. The pain is at a severity of 10/10. The pain is severe. Associated symptoms include diarrhea and nausea. Pertinent negatives include fever, belching, vomiting, dysuria, frequency and headaches. The symptoms are aggravated by palpation. Nothing relieves the symptoms.    Past Medical History:  Diagnosis Date  . Asthma   . Diabetes mellitus without complication (HCC)   . Hypercholesterolemia   . Hypertension   . PTSD (post-traumatic stress disorder)   . Seasonal allergies   . Thyroid disease     There are no active problems to display for this patient.   Past Surgical History:  Procedure Laterality Date  . ABDOMINAL HYSTERECTOMY    . CHOLECYSTECTOMY    . THYROIDECTOMY       OB History   None      Home Medications    Prior to Admission medications   Medication Sig Start Date End Date Taking? Authorizing Provider  atorvastatin (LIPITOR) 20 MG tablet Take 20 mg by mouth daily.    [provider]  cetirizine (ZYRTEC) 10 MG tablet Take 10 mg by mouth daily.    [provider]  Fluoxetine HCl, PMDD, (SARAFEM) 10 MG TABS Take by mouth.    [provider]  furosemide (LASIX) 20 MG tablet Take 1 tablet (20 mg total) by mouth daily. 04/01/18   Horton, Mayer Masker, MD  gabapentin (NEURONTIN) 300 MG capsule Take 300 mg by  mouth 3 (three) times daily.    [provider]  hydrochlorothiazide (MICROZIDE) 12.5 MG capsule Take 12.5 mg by mouth daily.    [provider]  HYDROcodone-acetaminophen (NORCO/VICODIN) 5-325 MG tablet Take 1 tablet by mouth every 6 (six) hours as needed. 05/20/18   Horton, Mayer Masker, MD  HYDROcodone-homatropine (HYCODAN) 5-1.5 MG/5ML syrup Take 5 mLs by mouth every 6 (six) hours as needed for cough. 06/03/17   Kirichenko, Lemont Fillers, PA-C  levothyroxine (SYNTHROID, LEVOTHROID) 200 MCG tablet Take 200 mcg by mouth daily before breakfast.    [provider]  lisinopril (PRINIVIL,ZESTRIL) 10 MG tablet Take 10 mg by mouth daily.    [provider]  meloxicam (MOBIC) 7.5 MG tablet Take 7.5 mg by mouth daily.    [provider]  metFORMIN (GLUCOPHAGE) 500 MG tablet Take by mouth 2 (two) times daily with a meal.    [provider]  naproxen (NAPROSYN) 500 MG tablet Take 1 tablet (500 mg total) by mouth 2 (two) times daily. 04/01/18   Horton, Mayer Masker, MD  Omega-3 Fatty Acids (FISH OIL) 1000 MG CPDR Take 2 capsules by mouth daily.    [provider]  predniSONE (DELTASONE) 10 MG tablet Take 40 mg by mouth daily with breakfast.    [provider]  traZODone (DESYREL) 100 MG tablet Take 100 mg by mouth at bedtime.    [provider]    Family History No family history on file.  Social History Social History   Tobacco Use  . Smoking status: Current Every Day Smoker    Types: Cigarettes  . Smokeless tobacco: Never Used  Substance Use Topics  . Alcohol use: No  . Drug use: No     Allergies   Phenergan [promethazine hcl]; Cefdinir; and Motrin [ibuprofen]   Review of Systems Review of Systems  Constitutional: Negative for chills, diaphoresis, fatigue and fever.  HENT: Negative for congestion.   Eyes: Negative for visual disturbance.  Respiratory: Negative for cough, chest tightness and shortness of breath.     Cardiovascular: Negative for chest pain and palpitations.  Gastrointestinal: Positive for abdominal pain, diarrhea and nausea. Negative for abdominal distention and vomiting.  Genitourinary: Positive for flank pain. Negative for decreased urine volume, dysuria, frequency, pelvic pain, vaginal bleeding, vaginal discharge and vaginal pain.  Musculoskeletal: Positive for back pain. Negative for neck pain and neck stiffness.  Skin: Negative for rash and wound.  Neurological: Negative for light-headedness and headaches.  Psychiatric/Behavioral: Negative for agitation.  All other systems reviewed and are negative.    Physical Exam Updated Vital Signs BP (!) 170/110 (BP Location: Right Arm)   Pulse 97   Temp 98.1 F (36.7 C) (Oral)   Resp 19   Ht 5\' 7"  (1.702 m)   Wt (!) 146.5 kg   SpO2 96%   BMI 50.59 kg/m   Physical Exam  Constitutional: She appears well-developed and well-nourished. No distress.  HENT:  Head: Normocephalic and atraumatic.  Mouth/Throat: Oropharynx is clear and moist. No oropharyngeal exudate.  Eyes: Pupils are equal, round, and reactive to light. Conjunctivae and EOM are normal.  Neck: Neck supple.  Cardiovascular: Normal rate and regular rhythm.  No murmur heard. Pulmonary/Chest: Effort normal and breath sounds normal. No respiratory distress. She has no wheezes. She has no rales. She exhibits no tenderness.  Abdominal: Soft. Normal appearance and bowel sounds are normal. There is tenderness in the left upper quadrant and left lower quadrant. There is CVA tenderness. There is no rigidity, no rebound and no guarding. A hernia is present.    Musculoskeletal: She exhibits tenderness. She exhibits no edema.       Back:  Lymphadenopathy:    She has no cervical adenopathy.  Neurological: She is alert. No sensory deficit. She exhibits normal muscle tone.  Skin: Skin is warm and dry. Capillary refill takes less than 2 seconds. No rash noted. She is not diaphoretic.  No erythema.  Psychiatric: She has a normal mood and affect.  Nursing note and vitals reviewed.    ED Treatments / Results  Labs (all labs ordered are listed, but only abnormal results are displayed) Labs Reviewed  COMPREHENSIVE METABOLIC PANEL - Abnormal; Notable for the following components:      Result Value   Glucose, Bld 162 (*)    Calcium 8.8 (*)    All other components within normal limits  CBC - Abnormal; Notable for the following components:   WBC 14.2 (*)    All other components within normal limits  URINE CULTURE  URINALYSIS, ROUTINE W REFLEX MICROSCOPIC  LIPASE, BLOOD  I-STAT CG4 LACTIC ACID, ED  I-STAT CG4 LACTIC ACID, ED    EKG None  Radiology Ct Abdomen Pelvis W Contrast  Result Date: 07/12/2018 CLINICAL DATA:  Initial evaluation for acute left flank and abdominal pain. EXAM: CT ABDOMEN AND PELVIS WITH CONTRAST TECHNIQUE: Multidetector CT imaging of the abdomen  and pelvis was performed using the standard protocol following bolus administration of intravenous contrast. CONTRAST:  ISOVUE-300 IOPAMIDOL (ISOVUE-300) INJECTION 61% COMPARISON:  Prior CT from 03/09/2006. FINDINGS: Lower chest: Mild scattered subsegmental atelectatic changes seen within the visualized lung bases. Visualized lungs are otherwise clear. Hepatobiliary: Diffuse hypoattenuation liver consistent with steatosis. Vague 2.4 hypodense lesion adjacent to the gallbladder fossa of noted, indeterminate, but suspected to reflect focal fat. Similar changes seen inferiorly. Liver otherwise unremarkable. Gallbladder surgically absent. No biliary dilatation. Pancreas: Pancreas within normal limits. Spleen: Spleen within normal limits. Adrenals/Urinary Tract: 2.2 cm adenoma present at the left adrenal gland. Adrenals otherwise unremarkable. Kidneys equal in size with symmetric enhancement. No nephrolithiasis, hydronephrosis, or focal enhancing renal mass. No hydroureter. Partially distended bladder within  normal limits. Stomach/Bowel: Stomach within normal limits. No evidence for bowel obstruction. Appendix is normal. Acute inflammatory stranding seen about a colonic diverticulum at the descending colon at the left lower quadrant, consistent with acute diverticulitis (series 2, image 71). No evidence for perforation or abscess. No other acute inflammatory changes seen about the bowels. Vascular/Lymphatic: Normal intravascular enhancement seen throughout the intra-abdominal aorta and its branch vessels. Mild aortic atherosclerosis. No aneurysm. No adenopathy. Reproductive: Uterus is absent. 6.2 cm cystic lesion within the central pelvis, suspected to be related to the right ovary (series 2, image 89). Few additional small cystic lesions at the left ovary measure up to 2.1 cm. Other: No free air or fluid. Fat containing ventral hernia measures 5.1 x 4.6 cm. Soft tissue stranding seen superiorly and inferiorly within the adjacent subcutaneous fat. Musculoskeletal: No acute osseous abnormality. No worrisome lytic or blastic osseous lesions. IMPRESSION: 1. Findings consistent with acute colonic diverticulitis. No evidence for perforation or other complication. 2. Right adnexal cystic lesion measuring up to 6.2 cm, indeterminate. A follow-up ultrasound in 6-12 weeks is recommended for further evaluation. 3. 5.1 cm fat containing ventral hernia. 4. Hepatic steatosis. Electronically Signed   By: Rise Mu M.D.   On: 07/12/2018 00:44    Procedures Procedures (including critical care time)  Medications Ordered in ED Medications  metroNIDAZOLE (FLAGYL) tablet 500 mg (has no administration in time range)  ciprofloxacin (CIPRO) tablet 500 mg (has no administration in time range)  oxyCODONE-acetaminophen (PERCOCET/ROXICET) 5-325 MG per tablet 1 tablet (has no administration in time range)  ondansetron (ZOFRAN) injection 4 mg (4 mg Intravenous Given 07/11/18 2133)  morphine 4 MG/ML injection 4 mg (4 mg  Intravenous Given 07/11/18 2212)  sodium chloride 0.9 % bolus 1,000 mL ( Intravenous Stopped 07/11/18 2316)  iopamidol (ISOVUE-300) 61 % injection 125 mL (125 mLs Intravenous Contrast Given 07/11/18 2355)     Initial Impression / Assessment and Plan / ED Course  I have reviewed the triage vital signs and the nursing notes.  Pertinent labs & imaging results that were available during my care of the patient were reviewed by me and considered in my medical decision making (see chart for details).     Neliah Cuyler is a 37 y.o. female with a past medical history significant for diverticulitis, diabetes, asthma, hypertension, hypercholesterolemia, thyroid disease, asthma, and strong family history of kidney stones who presents with left abdominal and flank pain.  Patient reports that starting yesterday she began having pain in her left sided abdomen.  She reports it also goes to her left lateral back.  She reports nausea but no vomiting.  She denies fevers, chills, or urinary symptoms.  She does report episodes of diarrhea yesterday and today.  She is unsure if this feels like prior diverticulitis and says she has never had a kidney stone despite of strong family history.  She denies any chest pain, shortness of breath, or trauma.  She reports the pain is up to 10 out of 10 in severity and patient is tearful.  She denies any vaginal discharge, vaginal bleeding, or any pelvic pain.  Pain is in the left mid abdomen and left flank.  On exam, patient has left-sided abdominal tenderness.  Patient has a palpable abdominal hernia near her umbilicus that is nontender, doubt incarceration.  Lungs are clear and chest is nontender.  Patient has left-sided CVA tenderness.  No right-sided CVA tenderness.  Given patient's symptoms of severe pain and nausea, patient was given nausea medicine, pain medicine, and fluids.  Clinically I am concerned most about a diverticulitis however kidney stone is also considered.  Urinalysis  did not show any evidence of UTI or hemoglobin, less likely to be kidney stone.  Patient will have CT scan with contrast to look for diverticulitis or colitis with an abscess.  Patient has a leukocytosis of 14.2.  Lipase not elevated.  Lactic acid initially normal.  Metabolic panel showed normal kidney function liver function.  Anticipate following up on CT results to determine disposition.  1:03 AM CT scan showed acute diverticulitis with no evidence of perforation or abscess.  Given the uncomplicated appearance of the diverticulitis, patient was felt to be candidate for outpatient management given the otherwise reassuring labs aside from a leukocytosis.  Patient for her pain felt better after medications.  Patient given a dose of oral Percocet and the antibiotic Cipro and Flagyl.  Patient given prescription for Cipro and Flagyl as an outpatient as well as pain and nausea medicine.  Patient will follow with her PCP in several days and understood return precautions.  Patient had no other questions or concerns and was discharged in good condition.   Final Clinical Impressions(s) / ED Diagnoses   Final diagnoses:  Left lower quadrant pain  Diverticulitis    ED Discharge Orders         Ordered    ciprofloxacin (CIPRO) 500 MG tablet  2 times daily     07/12/18 0102    metroNIDAZOLE (FLAGYL) 500 MG tablet  2 times daily     07/12/18 0102    oxyCODONE-acetaminophen (PERCOCET/ROXICET) 5-325 MG tablet  Every 4 hours PRN     07/12/18 0102    ondansetron (ZOFRAN) 4 MG tablet  Every 8 hours PRN     07/12/18 0102         Clinical Impression: 1. Left lower quadrant pain   2. Diverticulitis     Disposition: Discharge  Condition: Good  I have discussed the results, Dx and Tx plan with the pt(& family if present). He/she/they expressed understanding and agree(s) with the plan. Discharge instructions discussed at great length. Strict return precautions discussed and pt &/or family have  verbalized understanding of the instructions. No further questions at time of discharge.    New Prescriptions   CIPROFLOXACIN (CIPRO) 500 MG TABLET    Take 1 tablet (500 mg total) by mouth 2 (two) times daily for 10 days.   METRONIDAZOLE (FLAGYL) 500 MG TABLET    Take 1 tablet (500 mg total) by mouth 2 (two) times daily for 10 days.   ONDANSETRON (ZOFRAN) 4 MG TABLET    Take 1 tablet (4 mg total) by mouth every 8 (eight) hours as needed.   OXYCODONE-ACETAMINOPHEN (  PERCOCET/ROXICET) 5-325 MG TABLET    Take 1 tablet by mouth every 4 (four) hours as needed for severe pain.    Follow Up: Ignacia PalmaBeck, Mark C., MD [id number]  [address] [place] [id number] [phone]     [facility] [address] [id number] [id number] [address] [place] [id number] [phone]       Eveline Sauve, Canary Brimhristopher J, MD 07/12/18 [phone]

## 2018-07-11 NOTE — ED Triage Notes (Signed)
C/o LLQ, flank pain day 2-NAD-steady gait

## 2018-07-12 MED ORDER — OXYCODONE-ACETAMINOPHEN 5-325 MG PO TABS
1.0000 | ORAL_TABLET | Freq: Once | ORAL | Status: AC
  Administered 2018-07-12: 1 via ORAL
  Filled 2018-07-12: qty 1

## 2018-07-12 MED ORDER — CIPROFLOXACIN HCL 500 MG PO TABS
500.0000 mg | ORAL_TABLET | Freq: Once | ORAL | Status: AC
  Administered 2018-07-12: 500 mg via ORAL
  Filled 2018-07-12: qty 1

## 2018-07-12 MED ORDER — METRONIDAZOLE 500 MG PO TABS: 500.0000 mg | ORAL_TABLET | Freq: Two times a day (BID) | ORAL | 0 refills | Status: AC

## 2018-07-12 MED ORDER — OXYCODONE-ACETAMINOPHEN 5-325 MG PO TABS: 1.0000 | ORAL_TABLET | ORAL | 0 refills | Status: DC | PRN

## 2018-07-12 MED ORDER — CIPROFLOXACIN HCL 500 MG PO TABS: 500.0000 mg | ORAL_TABLET | Freq: Two times a day (BID) | ORAL | 0 refills | Status: AC

## 2018-07-12 MED ORDER — METRONIDAZOLE 500 MG PO TABS
500.0000 mg | ORAL_TABLET | Freq: Once | ORAL | Status: AC
  Administered 2018-07-12: 500 mg via ORAL
  Filled 2018-07-12: qty 1

## 2018-07-12 MED ORDER — ONDANSETRON HCL 4 MG PO TABS: 4.0000 mg | ORAL_TABLET | Freq: Three times a day (TID) | ORAL | 0 refills | Status: DC | PRN

## 2018-07-12 NOTE — Discharge Instructions (Signed)
Your work-up today revealed diverticulitis.  Please take the antibiotics to treat your infection and use the pain medicine and nausea medicine to help with your symptoms.  Please follow-up with your primary doctor in the next several days.  If any symptoms change or worsen, please return to the nearest emergency department.

## 2018-07-13 LAB — URINE CULTURE: CULTURE: NO GROWTH

## 2018-10-15 ENCOUNTER — Other Ambulatory Visit: Payer: Self-pay

## 2018-10-15 ENCOUNTER — Encounter (HOSPITAL_BASED_OUTPATIENT_CLINIC_OR_DEPARTMENT_OTHER): Payer: Self-pay | Admitting: Emergency Medicine

## 2018-10-15 ENCOUNTER — Emergency Department (HOSPITAL_BASED_OUTPATIENT_CLINIC_OR_DEPARTMENT_OTHER)
Admission: EM | Admit: 2018-10-15 | Discharge: 2018-10-15 | Disposition: A | Discharge status: Home / Self Care | Payer: Self-pay | Attending: Emergency Medicine | Admitting: Emergency Medicine

## 2018-10-15 ENCOUNTER — Emergency Department (HOSPITAL_BASED_OUTPATIENT_CLINIC_OR_DEPARTMENT_OTHER): Payer: Self-pay

## 2018-10-15 DIAGNOSIS — E119 Type 2 diabetes mellitus without complications: Secondary | ICD-10-CM | POA: Insufficient documentation

## 2018-10-15 DIAGNOSIS — J209 Acute bronchitis, unspecified: Secondary | ICD-10-CM | POA: Insufficient documentation

## 2018-10-15 DIAGNOSIS — F17210 Nicotine dependence, cigarettes, uncomplicated: Secondary | ICD-10-CM | POA: Insufficient documentation

## 2018-10-15 DIAGNOSIS — R062 Wheezing: Secondary | ICD-10-CM

## 2018-10-15 DIAGNOSIS — R0602 Shortness of breath: Secondary | ICD-10-CM

## 2018-10-15 DIAGNOSIS — J45901 Unspecified asthma with (acute) exacerbation: Secondary | ICD-10-CM | POA: Insufficient documentation

## 2018-10-15 DIAGNOSIS — Z7984 Long term (current) use of oral hypoglycemic drugs: Secondary | ICD-10-CM | POA: Insufficient documentation

## 2018-10-15 DIAGNOSIS — E079 Disorder of thyroid, unspecified: Secondary | ICD-10-CM | POA: Insufficient documentation

## 2018-10-15 DIAGNOSIS — Z79899 Other long term (current) drug therapy: Secondary | ICD-10-CM | POA: Insufficient documentation

## 2018-10-15 DIAGNOSIS — I10 Essential (primary) hypertension: Secondary | ICD-10-CM | POA: Insufficient documentation

## 2018-10-15 HISTORY — DX: Morbid (severe) obesity due to excess calories: E66.01

## 2018-10-15 MED ORDER — PREDNISONE 50 MG PO TABS: 50.0000 mg | ORAL_TABLET | Freq: Every day | ORAL | 0 refills | Status: DC

## 2018-10-15 MED ORDER — PREDNISONE 50 MG PO TABS
60.0000 mg | ORAL_TABLET | Freq: Once | ORAL | Status: AC
  Administered 2018-10-15: 60 mg via ORAL
  Filled 2018-10-15: qty 1

## 2018-10-15 MED ORDER — DOXYCYCLINE HYCLATE 100 MG PO CAPS: 100.0000 mg | ORAL_CAPSULE | Freq: Two times a day (BID) | ORAL | 0 refills | Status: AC

## 2018-10-15 MED ORDER — IPRATROPIUM-ALBUTEROL 0.5-2.5 (3) MG/3ML IN SOLN
3.0000 mL | Freq: Four times a day (QID) | RESPIRATORY_TRACT | Status: DC
  Administered 2018-10-15: 3 mL via RESPIRATORY_TRACT
  Filled 2018-10-15: qty 3

## 2018-10-15 MED ORDER — DOXYCYCLINE HYCLATE 100 MG PO TABS
100.0000 mg | ORAL_TABLET | Freq: Once | ORAL | Status: AC
  Administered 2018-10-15: 100 mg via ORAL
  Filled 2018-10-15: qty 1

## 2018-10-15 NOTE — ED Provider Notes (Signed)
[facility] EMERGENCY DEPARTMENT Provider Note   CSN: [id number] Arrival date & time: 10/15/18  4010     History   Chief Complaint Chief Complaint  Patient presents with  . Shortness of Breath    HPI Melissa Rollins is a 37 y.o. female.  The history is provided by the patient and medical records. No language interpreter was used.  Cough  This is a new problem. The current episode started yesterday. The problem occurs constantly. The problem has not changed since onset.The cough is non-productive. There has been no fever (subjective). Associated symptoms include chills, rhinorrhea, shortness of breath and wheezing. Pertinent negatives include no chest pain, no sweats, no headaches and no sore throat. She has tried nothing for the symptoms. Her past medical history is significant for bronchitis (hx of), pneumonia and asthma.  Wheezing   This is a recurrent problem. The current episode started yesterday. The problem occurs constantly. The problem has not changed since onset.Associated symptoms include rhinorrhea and cough. Pertinent negatives include no chest pain, no fever, no abdominal pain, no vomiting, no diarrhea, no dysuria, no headaches, no sore throat, no neck pain, no hemoptysis, no sputum production and no rash. She has tried beta-agonist inhalers for the symptoms. The treatment provided mild relief. Her past medical history is significant for asthma and pneumonia. Her past medical history does not include CAD or past MI.    Past Medical History:  Diagnosis Date  . Asthma   . Diabetes mellitus without complication (HCC)   . Hypercholesterolemia   . Hypertension   . Morbid obesity (HCC)   . PTSD (post-traumatic stress disorder)   . Seasonal allergies   . Thyroid disease     There are no active problems to display for this patient.   Past Surgical History:  Procedure Laterality Date  . ABDOMINAL HYSTERECTOMY    . CHOLECYSTECTOMY    . THYROIDECTOMY       OB  History   None      Home Medications    Prior to Admission medications   Medication Sig Start Date End Date Taking? Authorizing Provider  albuterol (PROVENTIL HFA;VENTOLIN HFA) 108 (90 Base) MCG/ACT inhaler Inhale into the lungs. 07/03/16  Yes [provider]  FLUoxetine (PROZAC) 10 MG tablet Take by mouth. 12/24/17  Yes [provider]  atorvastatin (LIPITOR) 20 MG tablet Take 20 mg by mouth daily.    [provider]  cetirizine (ZYRTEC) 10 MG tablet Take 10 mg by mouth daily.    [provider]  Fluoxetine HCl, PMDD, (SARAFEM) 10 MG TABS Take by mouth.    [provider]  furosemide (LASIX) 20 MG tablet Take 1 tablet (20 mg total) by mouth daily. 04/01/18   Horton, Mayer Masker, MD  gabapentin (NEURONTIN) 300 MG capsule Take 300 mg by mouth 3 (three) times daily.    [provider]  levothyroxine (SYNTHROID, LEVOTHROID) 200 MCG tablet Take 200 mcg by mouth daily before breakfast.    [provider]  lisinopril (PRINIVIL,ZESTRIL) 10 MG tablet Take 10 mg by mouth daily.    [provider]  metFORMIN (GLUCOPHAGE) 500 MG tablet Take by mouth 2 (two) times daily with a meal.    [provider]  Omega-3 Fatty Acids (FISH OIL) 1000 MG CPDR Take 2 capsules by mouth daily.    [provider]  traZODone (DESYREL) 100 MG tablet Take 100 mg by mouth at bedtime.    [provider]    [facility]  History No family history on file.  Social History Social History   Tobacco Use  . Smoking status: Current Every Day Smoker    Packs/day: 0.50    Types: Cigarettes  . Smokeless tobacco: Never Used  Substance Use Topics  . Alcohol use: No  . Drug use: No     Allergies   Phenergan [promethazine hcl]; Cefdinir; and Motrin [ibuprofen]   Review of Systems Review of Systems  Constitutional: Positive for chills. Negative for diaphoresis, fatigue and fever.  HENT: Positive for congestion and rhinorrhea.  Negative for sore throat.   Eyes: Negative for visual disturbance.  Respiratory: Positive for cough, chest tightness, shortness of breath and wheezing. Negative for hemoptysis, sputum production and stridor.   Cardiovascular: Negative for chest pain.  Gastrointestinal: Negative for abdominal pain, diarrhea, nausea and vomiting.  Genitourinary: Negative for dysuria, flank pain and frequency.  Musculoskeletal: Positive for back pain (with coughing). Negative for neck pain and neck stiffness.  Skin: Negative for rash and wound.  Neurological: Negative for light-headedness, numbness and headaches.  Psychiatric/Behavioral: Negative for agitation.  All other systems reviewed and are negative.    Physical Exam Updated Vital Signs BP 128/90 (BP Location: Right Arm)   Pulse 79   Temp 97.8 F (36.6 C) (Oral)   Resp 20   Ht 5\' 7"  (1.702 m)   Wt (!) 149.7 kg   SpO2 98%   BMI 51.69 kg/m   Physical Exam  Constitutional: She appears well-developed and well-nourished.  Non-toxic appearance. She does not appear ill. No distress.  HENT:  Head: Normocephalic and atraumatic.  Nose: Rhinorrhea present.  Mouth/Throat: Oropharynx is clear and moist. No oropharyngeal exudate.  Eyes: Pupils are equal, round, and reactive to light. Conjunctivae and EOM are normal.  Neck: Normal range of motion. Neck supple.  Cardiovascular: Normal rate, regular rhythm and intact distal pulses.  No murmur heard. Pulmonary/Chest: Effort normal. No respiratory distress. She has no decreased breath sounds. She has wheezes. She has rhonchi. She has no rales.  Abdominal: Soft. There is no tenderness.  Musculoskeletal:       Right lower leg: She exhibits edema. She exhibits no tenderness.       Left lower leg: She exhibits edema. She exhibits no tenderness.  Lymphadenopathy:    She has no cervical adenopathy.  Neurological: She is alert.  Skin: Skin is warm and dry. She is not diaphoretic. No erythema.  Psychiatric: She  has a normal mood and affect.  Nursing note and vitals reviewed.    ED Treatments / Results  Labs (all labs ordered are listed, but only abnormal results are displayed) Labs Reviewed - No data to display  EKG None  Radiology Dg Chest 2 View  Result Date: 10/15/2018 CLINICAL DATA:  Shortness of breath beginning yesterday.  Cough. EXAM: CHEST - 2 VIEW COMPARISON:  06/30/2018 FINDINGS: Heart and mediastinal shadows are normal. There is central bronchial thickening suggesting bronchitis. No consolidation, collapse or effusion. No significant bone finding. IMPRESSION: Bronchitis pattern.  No consolidation or collapse. Electronically Signed   By: Paulina Fusi M.D.   On: 10/15/2018 09:15    Procedures Procedures (including critical care time)  Medications Ordered in ED Medications  ipratropium-albuterol (DUONEB) 0.5-2.5 (3) MG/3ML nebulizer solution 3 mL (3 mLs Nebulization Given 10/15/18 0912)  doxycycline (VIBRA-TABS) tablet 100 mg (has no administration in time range)  predniSONE (DELTASONE) tablet 60 mg (has no administration in time range)     Initial Impression / Assessment and Plan /  ED Course  I have reviewed the triage vital signs and the nursing notes.  Pertinent labs & imaging results that were available during my care of the patient were reviewed by me and considered in my medical decision making (see chart for details).     Melissa PolingSandy Rollins is a 37 y.o. female with past medical history significant for asthma, seasonal allergies, diabetes, hyperglyceridemia, obesity, and PTSD who presents with chills, cough, congestion, wheezing, shortness of breath.  Patient reports that her symptoms began yesterday.  She says that she typically gets either bronchitis or pneumonia in the fall in the spring.  She says that she had chills starting yesterday and then developed a cough.  She reports the cough is been dry.  She is had some mild congestion.  She denies any trauma.  She denies any  chest pain or palpitations but does report some chest tightness and diffuse back tightness with her coughing.  She denies any urinary symptoms or GI symptoms.  No diaphoresis, nausea, vomiting, cuts patient, diarrhea, urinary symptoms.  She denies any change in chronic leg edema.  No other complaints on arrival.  She reports that she tried her inhaler yesterday which initially worked but then this morning did not seem to work.  On exam, patient has faint wheezing and some diffuse rhonchi.  Patient's chest was nontender, back nontender.  No murmur appreciated.  Legs have edema but she reports is unchanged.  Abdomen is nontender.  Patient is afebrile on arrival.  Clinical I am concerned patient either has asthma exacerbation in the setting of environmental changes versus pneumonia.  Patient also could have asthma exacerbation in the setting of URI as she is had the chills, congestion, and reports her daughter has had a cold recently.    Patient will chest x-ray and get a DuoNeb.  Shared decision making conversation led to decision not to pursue laboratory testing on arrival.  Anticipate reassessment.      9:54 AM X-ray shows no evidence of pneumonia but there is evidence of bronchitis pattern.  Given her history of bronchitis, I am concerned about acute on chronic bronchitis in the setting of her asthma and URI.    Patient will be given prescription for doxycycline and was given a dose here.  She will be given prescription for redness and was given a dose here.  Patient will follow-up with her PCP for bronchitis and asthma exacerbation.  Patient understood return precautions.  Patient had no other questions or concerns and was discharged in good condition with reassuring vital signs.    Final Clinical Impressions(s) / ED Diagnoses   Final diagnoses:  Acute bronchitis, unspecified organism  Exacerbation of asthma, unspecified asthma severity, unspecified whether persistent  Shortness of breath    Wheeze    ED Discharge Orders         Ordered    predniSONE (DELTASONE) 50 MG tablet  Daily     10/15/18 0952    doxycycline (VIBRAMYCIN) 100 MG capsule  2 times daily     10/15/18 0952          Clinical Impression: 1. Acute bronchitis, unspecified organism   2. Exacerbation of asthma, unspecified asthma severity, unspecified whether persistent   3. Shortness of breath   4. Wheeze     Disposition: Discharge  Condition: Good  I have discussed the results, Dx and Tx plan with the pt(& family if present). He/she/they expressed understanding and agree(s) with the plan. Discharge instructions discussed at great length.  Strict return precautions discussed and pt &/or family have verbalized understanding of the instructions. No further questions at time of discharge.    New Prescriptions   DOXYCYCLINE (VIBRAMYCIN) 100 MG CAPSULE    Take 1 capsule (100 mg total) by mouth 2 (two) times daily for 7 days.   PREDNISONE (DELTASONE) 50 MG TABLET    Take 1 tablet (50 mg total) by mouth daily.    Follow Up: [place] [address]  [address] [place] [address] [phone]     [facility] EMERGENCY DEPARTMENT [id number] [address] [place] [address] [phone]       , Canary Brim, MD 10/15/18 [phone]

## 2018-10-15 NOTE — Discharge Instructions (Signed)
Your x-ray today confirmed acute bronchitis.  Given your history of the same and chronic bronchitis, we feel this is an exacerbation with acute on chronic bronchitis.  Thus, we are prescribing you prescription for doxycycline to take twice a day for the next 7 days.  Please also take steroids for the next 4 days starting tomorrow.  You were given a dose of steroids today here.  Please follow-up with your primary doctor and continue using your inhaler at home.  If any symptoms change or worsen, please return to the nearest emergency department.

## 2018-10-15 NOTE — ED Triage Notes (Signed)
SOB since yesterday but yesterday the inhaler was helping.  This morning it is not. Dry cough started last night.  Pt smokes.

## 2018-12-23 ENCOUNTER — Encounter (HOSPITAL_COMMUNITY): Payer: Self-pay | Admitting: *Deleted

## 2018-12-23 ENCOUNTER — Emergency Department (HOSPITAL_COMMUNITY): Payer: Self-pay

## 2018-12-23 ENCOUNTER — Other Ambulatory Visit: Payer: Self-pay

## 2018-12-23 ENCOUNTER — Emergency Department (HOSPITAL_COMMUNITY)
Admission: EM | Admit: 2018-12-23 | Discharge: 2018-12-24 | Disposition: A | Discharge status: Home / Self Care | Payer: Self-pay | Attending: Emergency Medicine | Admitting: Emergency Medicine

## 2018-12-23 DIAGNOSIS — E119 Type 2 diabetes mellitus without complications: Secondary | ICD-10-CM | POA: Insufficient documentation

## 2018-12-23 DIAGNOSIS — Z7984 Long term (current) use of oral hypoglycemic drugs: Secondary | ICD-10-CM | POA: Insufficient documentation

## 2018-12-23 DIAGNOSIS — Z720 Tobacco use: Secondary | ICD-10-CM

## 2018-12-23 DIAGNOSIS — J069 Acute upper respiratory infection, unspecified: Secondary | ICD-10-CM | POA: Insufficient documentation

## 2018-12-23 DIAGNOSIS — F17210 Nicotine dependence, cigarettes, uncomplicated: Secondary | ICD-10-CM | POA: Insufficient documentation

## 2018-12-23 DIAGNOSIS — I10 Essential (primary) hypertension: Secondary | ICD-10-CM | POA: Insufficient documentation

## 2018-12-23 DIAGNOSIS — Z79899 Other long term (current) drug therapy: Secondary | ICD-10-CM | POA: Insufficient documentation

## 2018-12-23 MED ORDER — ALBUTEROL SULFATE HFA 108 (90 BASE) MCG/ACT IN AERS
1.0000 | INHALATION_SPRAY | RESPIRATORY_TRACT | Status: DC | PRN
  Administered 2018-12-24: 1 via RESPIRATORY_TRACT
  Filled 2018-12-23: qty 6.7

## 2018-12-23 MED ORDER — OXYMETAZOLINE HCL 0.05 % NA SOLN
1.0000 | Freq: Once | NASAL | Status: AC
  Administered 2018-12-24: 1 via NASAL
  Filled 2018-12-23: qty 15

## 2018-12-23 MED ORDER — AEROCHAMBER PLUS FLO-VU MEDIUM MISC
1.0000 | Freq: Once | Status: AC
  Administered 2018-12-24: 1
  Filled 2018-12-23: qty 1

## 2018-12-23 NOTE — ED Triage Notes (Signed)
Pt reports sneezing, coughing, ear pain, and headache since Friday night.  Pt has taken Coricidin without relief.  Pt a/o x 4 and ambulatory. Pt denies fevers/chills, n/v.

## 2018-12-23 NOTE — ED Provider Notes (Signed)
[facility]-EMERGENCY DEPT Provider Note   CSN: [id number] Arrival date & time: 12/23/18  2115     History   Chief Complaint Chief Complaint  Patient presents with  . Cough  . Otalgia    HPI Melissa Rollins is a 38 y.o. female.  Pt presents to the ED today with cough, sinus congestion, and ear pain.  Pt has been sick since 1/24.  She has taken multiple otc meds without improvement in sx.  Pt denies f/c.  Pt does smoke.     Past Medical History:  Diagnosis Date  . Asthma   . Diabetes mellitus without complication (HCC)   . Hypercholesterolemia   . Hypertension   . Morbid obesity (HCC)   . PTSD (post-traumatic stress disorder)   . Seasonal allergies   . Thyroid disease     There are no active problems to display for this patient.   Past Surgical History:  Procedure Laterality Date  . ABDOMINAL HYSTERECTOMY    . CHOLECYSTECTOMY    . THYROIDECTOMY       OB History   No obstetric history on file.      Home Medications    Prior to Admission medications   Medication Sig Start Date End Date Taking? Authorizing Provider  albuterol (PROVENTIL HFA;VENTOLIN HFA) 108 (90 Base) MCG/ACT inhaler Inhale into the lungs. 07/03/16   [provider]  atorvastatin (LIPITOR) 20 MG tablet Take 20 mg by mouth daily.    [provider]  cetirizine (ZYRTEC) 10 MG tablet Take 10 mg by mouth daily.    [provider]  FLUoxetine (PROZAC) 10 MG tablet Take by mouth. 12/24/17   [provider]  Fluoxetine HCl, PMDD, (SARAFEM) 10 MG TABS Take by mouth.    [provider]  furosemide (LASIX) 20 MG tablet Take 1 tablet (20 mg total) by mouth daily. 04/01/18   Horton, Mayer Masker, MD  gabapentin (NEURONTIN) 300 MG capsule Take 300 mg by mouth 3 (three) times daily.    [provider]  HYDROcodone-acetaminophen (NORCO/VICODIN) 5-325 MG tablet Take 1 tablet by mouth every 4 (four) hours as needed. 12/24/18   Jacalyn Lefevre, MD    levothyroxine (SYNTHROID, LEVOTHROID) 200 MCG tablet Take 200 mcg by mouth daily before breakfast.    [provider]  lisinopril (PRINIVIL,ZESTRIL) 10 MG tablet Take 10 mg by mouth daily.    [provider]  metFORMIN (GLUCOPHAGE) 500 MG tablet Take by mouth 2 (two) times daily with a meal.    [provider]  Omega-3 Fatty Acids (FISH OIL) 1000 MG CPDR Take 2 capsules by mouth daily.    [provider]  predniSONE (DELTASONE) 50 MG tablet Take 1 tablet (50 mg total) by mouth daily. 10/16/18   Tegeler, Canary Brim, MD  traZODone (DESYREL) 100 MG tablet Take 100 mg by mouth at bedtime.    [provider]    Family History No family history on file.  Social History Social History   Tobacco Use  . Smoking status: Current Every Day Smoker    Packs/day: 0.50    Types: Cigarettes  . Smokeless tobacco: Never Used  Substance Use Topics  . Alcohol use: No  . Drug use: No     Allergies   Doxycycline; Phenergan [promethazine hcl]; Cefdinir; and Motrin [ibuprofen]   Review of Systems Review of Systems  HENT: Positive for congestion, ear pain, sinus pressure and sinus pain.   Respiratory: Positive for cough.   All other  systems reviewed and are negative.    Physical Exam Updated Vital Signs BP (!) 147/84 (BP Location: Left Arm)   Pulse 94   Temp 98.8 F (37.1 C) (Oral)   Resp 20   Ht 5\' 7"  (1.702 m)   Wt (!) 146.5 kg   SpO2 94%   BMI 50.59 kg/m   Physical Exam Vitals signs and nursing note reviewed.  Constitutional:      Appearance: Normal appearance. She is obese.  HENT:     Head: Normocephalic and atraumatic.     Right Ear: Tympanic membrane and external ear normal.     Left Ear: Tympanic membrane and external ear normal.     Nose: Congestion present.     Mouth/Throat:     Mouth: Mucous membranes are moist.     Pharynx: Oropharynx is clear.  Eyes:     Extraocular Movements: Extraocular movements intact.      Conjunctiva/sclera: Conjunctivae normal.     Pupils: Pupils are equal, round, and reactive to light.  Neck:     Musculoskeletal: Normal range of motion and neck supple.  Cardiovascular:     Rate and Rhythm: Normal rate and regular rhythm.     Pulses: Normal pulses.     Heart sounds: Normal heart sounds.  Pulmonary:     Effort: Pulmonary effort is normal.     Breath sounds: Normal breath sounds.  Abdominal:     General: Abdomen is flat.  Musculoskeletal: Normal range of motion.  Skin:    General: Skin is warm.     Capillary Refill: Capillary refill takes less than 2 seconds.  Neurological:     General: No focal deficit present.     Mental Status: She is alert and oriented to person, place, and time.  Psychiatric:        Mood and Affect: Mood normal.        Behavior: Behavior normal.      ED Treatments / Results  Labs (all labs ordered are listed, but only abnormal results are displayed) Labs Reviewed - No data to display  EKG None  Radiology Dg Chest 2 View  Result Date: 12/24/2018 CLINICAL DATA:  Cough EXAM: CHEST - 2 VIEW COMPARISON:  None. FINDINGS: The heart size and mediastinal contours are within normal limits. Both lungs are clear. The visualized skeletal structures are unremarkable. IMPRESSION: No active cardiopulmonary disease. Electronically Signed   By: Deatra RobinsonKevin  Herman M.D.   On: 12/24/2018 00:14    Procedures Procedures (including critical care time)  Medications Ordered in ED Medications  albuterol (PROVENTIL HFA;VENTOLIN HFA) 108 (90 Base) MCG/ACT inhaler 1-2 puff (1 puff Inhalation Given 12/24/18 0002)  AEROCHAMBER PLUS FLO-VU MEDIUM MISC 1 each (1 each Other Given 12/24/18 0002)  oxymetazoline (AFRIN) 0.05 % nasal spray 1 spray (1 spray Each Nare Given 12/24/18 0002)     Initial Impression / Assessment and Plan / ED Course  I have reviewed the triage vital signs and the nursing notes.  Pertinent labs & imaging results that were available during my care  of the patient were reviewed by me and considered in my medical decision making (see chart for details).     Pt is given an albuterol inhaler and spacer.  No pna on CXR.  She is given afrin.  She will be given a rx for lortab to help with the cough and pain.  Return if worse.  Try to stop smoking.  F/u with pcp.  Final Clinical Impressions(s) / ED Diagnoses  Final diagnoses:  Viral upper respiratory tract infection  Tobacco abuse    ED Discharge Orders         Ordered    HYDROcodone-acetaminophen (NORCO/VICODIN) 5-325 MG tablet  Every 4 hours PRN     12/24/18 0030           Jacalyn Lefevre, MD 12/24/18 0031

## 2018-12-24 MED ORDER — HYDROCODONE-ACETAMINOPHEN 5-325 MG PO TABS: 1.0000 | ORAL_TABLET | ORAL | 0 refills | Status: DC | PRN

## 2018-12-24 NOTE — ED Notes (Signed)
Pt verbalized discharge instructions and follow up care. Alert and ambulatory  

## 2018-12-24 NOTE — Discharge Instructions (Signed)
Try to stop smoking. °

## 2019-01-13 ENCOUNTER — Emergency Department (HOSPITAL_COMMUNITY)
Admission: EM | Admit: 2019-01-13 | Discharge: 2019-01-13 | Disposition: A | Discharge status: Home / Self Care | Payer: Medicaid Other | Attending: Emergency Medicine | Admitting: Emergency Medicine

## 2019-01-13 ENCOUNTER — Emergency Department (HOSPITAL_COMMUNITY): Payer: Medicaid Other

## 2019-01-13 ENCOUNTER — Encounter (HOSPITAL_COMMUNITY): Payer: Self-pay | Admitting: Emergency Medicine

## 2019-01-13 ENCOUNTER — Other Ambulatory Visit: Payer: Self-pay

## 2019-01-13 DIAGNOSIS — F17210 Nicotine dependence, cigarettes, uncomplicated: Secondary | ICD-10-CM | POA: Insufficient documentation

## 2019-01-13 DIAGNOSIS — Z7984 Long term (current) use of oral hypoglycemic drugs: Secondary | ICD-10-CM | POA: Insufficient documentation

## 2019-01-13 DIAGNOSIS — I10 Essential (primary) hypertension: Secondary | ICD-10-CM | POA: Insufficient documentation

## 2019-01-13 DIAGNOSIS — R202 Paresthesia of skin: Secondary | ICD-10-CM | POA: Insufficient documentation

## 2019-01-13 DIAGNOSIS — E119 Type 2 diabetes mellitus without complications: Secondary | ICD-10-CM | POA: Insufficient documentation

## 2019-01-13 DIAGNOSIS — M25511 Pain in right shoulder: Secondary | ICD-10-CM | POA: Insufficient documentation

## 2019-01-13 DIAGNOSIS — M542 Cervicalgia: Secondary | ICD-10-CM | POA: Insufficient documentation

## 2019-01-13 DIAGNOSIS — J45909 Unspecified asthma, uncomplicated: Secondary | ICD-10-CM | POA: Insufficient documentation

## 2019-01-13 DIAGNOSIS — Z79899 Other long term (current) drug therapy: Secondary | ICD-10-CM | POA: Insufficient documentation

## 2019-01-13 MED ORDER — METHOCARBAMOL 500 MG PO TABS
500.0000 mg | ORAL_TABLET | Freq: Once | ORAL | Status: AC
  Administered 2019-01-13: 500 mg via ORAL
  Filled 2019-01-13: qty 1

## 2019-01-13 MED ORDER — METHOCARBAMOL 500 MG PO TABS: 500.0000 mg | ORAL_TABLET | Freq: Two times a day (BID) | ORAL | 0 refills | Status: DC

## 2019-01-13 MED ORDER — HYDROCODONE-ACETAMINOPHEN 5-325 MG PO TABS: 1.0000 | ORAL_TABLET | Freq: Four times a day (QID) | ORAL | 0 refills | Status: DC | PRN

## 2019-01-13 MED ORDER — HYDROCODONE-ACETAMINOPHEN 5-325 MG PO TABS
1.0000 | ORAL_TABLET | Freq: Once | ORAL | Status: AC
  Administered 2019-01-13: 1 via ORAL
  Filled 2019-01-13: qty 1

## 2019-01-13 NOTE — Discharge Instructions (Addendum)
1. Medications: Vicodin and Robaxin, usual home medications 2. Treatment: rest, ice, elevate and use brace, drink plenty of fluids, gentle stretching 3. Follow Up: Please followup with orthopedics as directed or your PCP in 1 week if no improvement for discussion of your diagnoses and further evaluation after today's visit; if you do not have a primary care doctor use the resource guide provided to find one; Please return to the ER for worsening symptoms or other concerns

## 2019-01-13 NOTE — ED Provider Notes (Signed)
[facility]-EMERGENCY DEPT Provider Note   CSN: [id number] Arrival date & time: 01/13/19  0125     History   Chief Complaint Chief Complaint  Patient presents with  . Shoulder Pain    HPI Melissa Rollins is a 38 y.o. female with a hx of non-insulin-dependent diabetes, asthma, hypertension, morbid obesity, PTSD presents to the Emergency Department complaining of gradual, persistent, progressively worsening right shoulder pain onset 2 days ago.  Patient reports she has a history of radiculopathy in the right shoulder.  She reports that her primary care doctor treats this with Norco and Flexeril.  She denies known injury, falls, new activities.  Patient reports the pain starts in her posterior neck on the right side and radiates into her right trapezius and into her right shoulder.  She reports it also radiates into her right upper arm.  She reports some feeling of paresthesias in the upper aspect of the bicep but no numbness or weakness in the left arm.  Movement and palpation make the symptoms worse.  Nothing makes them better.  The history is provided by the patient, medical records and a significant other. No language interpreter was used.    Past Medical History:  Diagnosis Date  . Asthma   . Diabetes mellitus without complication (HCC)   . Hypercholesterolemia   . Hypertension   . Morbid obesity (HCC)   . PTSD (post-traumatic stress disorder)   . Seasonal allergies   . Thyroid disease     There are no active problems to display for this patient.   Past Surgical History:  Procedure Laterality Date  . ABDOMINAL HYSTERECTOMY    . CHOLECYSTECTOMY    . THYROIDECTOMY       OB History   No obstetric history on file.      Home Medications    Prior to Admission medications   Medication Sig Start Date End Date Taking? Authorizing Provider  albuterol (PROVENTIL HFA;VENTOLIN HFA) 108 (90 Base) MCG/ACT inhaler Inhale into the lungs. 07/03/16   [provider]  atorvastatin (LIPITOR) 20 MG tablet Take 20 mg by mouth daily.    [provider]  cetirizine (ZYRTEC) 10 MG tablet Take 10 mg by mouth daily.    [provider]  FLUoxetine (PROZAC) 10 MG tablet Take by mouth. 12/24/17   [provider]  Fluoxetine HCl, PMDD, (SARAFEM) 10 MG TABS Take by mouth.    [provider]  furosemide (LASIX) 20 MG tablet Take 1 tablet (20 mg total) by mouth daily. 04/01/18   Horton, Mayer Maskerourtney F, MD  gabapentin (NEURONTIN) 300 MG capsule Take 300 mg by mouth 3 (three) times daily.    [provider]  HYDROcodone-acetaminophen (NORCO/VICODIN) 5-325 MG tablet Take 1 tablet by mouth every 6 (six) hours as needed. 01/13/19   Adams Hinch, Dahlia ClientHannah, PA-C  levothyroxine (SYNTHROID, LEVOTHROID) 200 MCG tablet Take 200 mcg by mouth daily before breakfast.    [provider]  lisinopril (PRINIVIL,ZESTRIL) 10 MG tablet Take 10 mg by mouth daily.    [provider]  metFORMIN (GLUCOPHAGE) 500 MG tablet Take by mouth 2 (two) times daily with a meal.    [provider]  methocarbamol (ROBAXIN) 500 MG tablet Take 1 tablet (500 mg total) by mouth 2 (two) times daily. 01/13/19   Amore Grater, Dahlia ClientHannah, PA-C  Omega-3 Fatty Acids (FISH OIL) 1000 MG CPDR Take 2 capsules by mouth daily.    [provider]  predniSONE (DELTASONE) 50 MG tablet Take 1  tablet (50 mg total) by mouth daily. 10/16/18   Tegeler, Canary Brim, MD  traZODone (DESYREL) 100 MG tablet Take 100 mg by mouth at bedtime.    [provider]    Family History History reviewed. No pertinent family history.  Social History Social History   Tobacco Use  . Smoking status: Current Every Day Smoker    Packs/day: 0.50    Types: Cigarettes  . Smokeless tobacco: Never Used  Substance Use Topics  . Alcohol use: No  . Drug use: No     Allergies   Ciprofloxacin hcl; Doxycycline; Promethazine; Promethazine hcl; Ibuprofen;  Limonene; Other; Cefdinir; Iodinated diagnostic agents; and Ciprofloxacin   Review of Systems Review of Systems  Constitutional: Negative for chills and fever.  Gastrointestinal: Negative for nausea and vomiting.  Musculoskeletal: Positive for arthralgias. Negative for back pain, joint swelling, neck pain and neck stiffness.  Skin: Negative for wound.  Neurological: Negative for numbness.  Hematological: Does not bruise/bleed easily.  Psychiatric/Behavioral: The patient is not nervous/anxious.   All other systems reviewed and are negative.    Physical Exam Updated Vital Signs BP (!) 142/89 (BP Location: Right Arm)   Pulse 86   Temp 97.8 F (36.6 C) (Oral)   Resp 20   Ht 5\' 7"  (1.702 m)   Wt (!) 146.5 kg   SpO2 97%   BMI 50.59 kg/m   Physical Exam Vitals signs and nursing note reviewed.  Constitutional:      General: She is not in acute distress.    Appearance: She is well-developed. She is not diaphoretic.  HENT:     Head: Normocephalic and atraumatic.  Eyes:     Conjunctiva/sclera: Conjunctivae normal.  Neck:     Musculoskeletal: Normal range of motion. Muscular tenderness present.      Comments: Full range of motion of the neck.  Tenderness to palpation along the right paraspinal muscles.  No midline tenderness, step-off or deformity. Cardiovascular:     Rate and Rhythm: Normal rate and regular rhythm.     Comments: Capillary refill < 3 sec Pulmonary:     Effort: Pulmonary effort is normal.     Breath sounds: Normal breath sounds.  Musculoskeletal:        General: Tenderness present.     Right shoulder: She exhibits decreased range of motion, tenderness and pain. She exhibits no swelling, no effusion, no crepitus, no deformity and no laceration.     Right elbow: Normal.    Right wrist: Normal.     Right hand: Normal. She exhibits normal range of motion, no tenderness, no bony tenderness, normal capillary refill, no deformity and no laceration. Normal sensation  noted. Normal strength noted.     Comments: Some decreased range of motion in the right shoulder.  Patient is able to Abduct to approximately 90 degrees.  Additionally, tenderness to palpation along the right trapezius.  Skin:    General: Skin is warm and dry.     Comments: No tenting of the skin  Neurological:     Mental Status: She is alert.     Coordination: Coordination normal.     Comments: Sensation in tact to normal touch in the right upper extremity. Strength 5/5 with flexion extension of the right elbow and wrist.  5/5 grip strength and flexion extension of all fingers.  5/5 strength with abduction and adduction of the shoulder however this does cause significant pain.      ED Treatments / Results   Radiology Dg  Shoulder Right  Result Date: 01/13/2019 CLINICAL DATA:  Initial evaluation for acute right shoulder pain and numbness for 2 days. EXAM: RIGHT SHOULDER - 2+ VIEW COMPARISON:  None. FINDINGS: No acute fracture dislocation. Humeral head in normal line with the glenoid. AC joint approximated. Mild osteoarthritic changes about the right glenohumeral and acromioclavicular articulations. No periarticular calcification. Soft tissues normal. Visualized right hemithorax grossly clear. IMPRESSION: 1. No acute osseous abnormality about the right shoulder. 2. 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 Electronically Signed   By: Rise Mu M.D.   On: 01/13/2019 02:40    Procedures Procedures (including critical care time)  Medications Ordered in ED Medications  HYDROcodone-acetaminophen (NORCO/VICODIN) 5-325 MG per tablet 1 tablet (1 tablet Oral Given 01/13/19 0242)  methocarbamol (ROBAXIN) tablet 500 mg (500 mg Oral Given 01/13/19 0242)     Initial Impression / Assessment and Plan / ED Course  I have reviewed the triage vital signs and the nursing notes.  Pertinent labs & imaging results that were  available during my care of the patient were reviewed by me and considered in my medical decision making (see chart for details).     Patient X-Ray negative for obvious fracture or dislocation.  Arthritis is noted.  Personally evaluated these images.  Pain managed in ED. Pt advised to follow up with [facility] care for further management. Conservative therapy recommended and discussed. Patient will be dc home & is agreeable with above plan.  Patient is requesting Norco and Flexeril.  I did check the narcotic database.  Patient has had a number of small prescriptions from several providers over the last year.  Last narcotic was filled in late January from this facility and was given for 2 days.  I will give 2 days of pain control until patient can be seen by her primary care provider.  Long discussion with patient and significant other about this.  They state understanding and are in agreement with the plan.   Final Clinical Impressions(s) / ED Diagnoses   Final diagnoses:  Acute pain of right shoulder    ED Discharge Orders         Ordered    HYDROcodone-acetaminophen (NORCO/VICODIN) 5-325 MG tablet  Every 6 hours PRN     01/13/19 0304    methocarbamol (ROBAXIN) 500 MG tablet  2 times daily     01/13/19 0304           Mirian Casco, Boyd Kerbs 01/13/19 0311    Ward, Layla Maw, DO 01/13/19 0327

## 2019-10-06 ENCOUNTER — Other Ambulatory Visit: Payer: Self-pay

## 2019-10-06 ENCOUNTER — Emergency Department (HOSPITAL_BASED_OUTPATIENT_CLINIC_OR_DEPARTMENT_OTHER)
Admission: EM | Admit: 2019-10-06 | Discharge: 2019-10-06 | Disposition: A | Discharge status: Home / Self Care | Payer: Medicaid Other | Attending: Emergency Medicine | Admitting: Emergency Medicine

## 2019-10-06 ENCOUNTER — Encounter (HOSPITAL_BASED_OUTPATIENT_CLINIC_OR_DEPARTMENT_OTHER): Payer: Self-pay

## 2019-10-06 DIAGNOSIS — E119 Type 2 diabetes mellitus without complications: Secondary | ICD-10-CM | POA: Insufficient documentation

## 2019-10-06 DIAGNOSIS — Y9289 Other specified places as the place of occurrence of the external cause: Secondary | ICD-10-CM | POA: Insufficient documentation

## 2019-10-06 DIAGNOSIS — Y999 Unspecified external cause status: Secondary | ICD-10-CM | POA: Insufficient documentation

## 2019-10-06 DIAGNOSIS — S39012A Strain of muscle, fascia and tendon of lower back, initial encounter: Secondary | ICD-10-CM | POA: Insufficient documentation

## 2019-10-06 DIAGNOSIS — Z7984 Long term (current) use of oral hypoglycemic drugs: Secondary | ICD-10-CM | POA: Insufficient documentation

## 2019-10-06 DIAGNOSIS — Z79899 Other long term (current) drug therapy: Secondary | ICD-10-CM | POA: Insufficient documentation

## 2019-10-06 DIAGNOSIS — X500XXA Overexertion from strenuous movement or load, initial encounter: Secondary | ICD-10-CM | POA: Insufficient documentation

## 2019-10-06 DIAGNOSIS — F17210 Nicotine dependence, cigarettes, uncomplicated: Secondary | ICD-10-CM | POA: Insufficient documentation

## 2019-10-06 DIAGNOSIS — J45909 Unspecified asthma, uncomplicated: Secondary | ICD-10-CM | POA: Insufficient documentation

## 2019-10-06 DIAGNOSIS — I10 Essential (primary) hypertension: Secondary | ICD-10-CM | POA: Insufficient documentation

## 2019-10-06 DIAGNOSIS — Y93E2 Activity, laundry: Secondary | ICD-10-CM | POA: Insufficient documentation

## 2019-10-06 MED ORDER — METHOCARBAMOL 500 MG PO TABS: 500.0000 mg | ORAL_TABLET | Freq: Two times a day (BID) | ORAL | 0 refills | Status: DC

## 2019-10-06 MED ORDER — CYCLOBENZAPRINE HCL 10 MG PO TABS: 10.0000 mg | ORAL_TABLET | Freq: Two times a day (BID) | ORAL | 0 refills | Status: DC | PRN

## 2019-10-06 NOTE — Discharge Instructions (Addendum)
Please call the orthopedist that I have provided should you continue to experience discomfort despite medication for continued evaluation and management of your acute on chronic low back pain.  Please F/U with your PCP regarding today's visit.   You were given a prescription for Robaxin which is a muscle relaxer.  You should not drive, work, consume alcohol, or operate machinery while taking this medication as it can make you very drowsy.     Please return to the ED should you develop any fever or chills, numbness, tingling, or weakness, or should you develop incontinence or worsening symptoms.

## 2019-10-06 NOTE — ED Triage Notes (Addendum)
Pt states she was bending felt/heard a pop to lower back 11/6-lower back pain started ~3 hours after-pain worse with movement-NAD-steady gait

## 2019-10-06 NOTE — ED Provider Notes (Signed)
[facility] EMERGENCY DEPARTMENT Provider Note   CSN: [id number] Arrival date & time: 10/06/19  1407     History   Chief Complaint Chief Complaint  Patient presents with  . Back Pain    HPI Melissa Rollins is a 38 y.o. female with PMH significant for DM, morbid obesity, and history of low back pain with sciatica presents to the ED with a 3-day history of lumbar pain.  She reports that she was unloading a dryer when she "felt a pop" and she has since experienced bilateral lumbar discomfort.  She reports the pain is worse with flexion at the hips or by lying flat.  She also reports pain and left-sided sciatica with certain torso rotation.  She denies any fevers or chills, personal history of cancer, IVDA, saddle anesthesia, incontinence, leg numbness, weakness, tingling, or any other neurologic symptoms.  She has taken Tylenol and ibuprofen, with little relief.  She would have followed up with her PCP today but he did not have any availability.  She denies any and all other symptoms.     HPI  Past Medical History:  Diagnosis Date  . Asthma   . Diabetes mellitus without complication ([place])   . Hypercholesterolemia   . Hypertension   . Morbid obesity ([place])   . PTSD (post-traumatic stress disorder)   . Seasonal allergies   . Thyroid disease     There are no active problems to display for this patient.   Past Surgical History:  Procedure Laterality Date  . ABDOMINAL HYSTERECTOMY    . CHOLECYSTECTOMY    . THYROIDECTOMY       OB History   No obstetric history on file.      Home Medications    Prior to Admission medications   Medication Sig Start Date End Date Taking? Authorizing Provider  albuterol (PROVENTIL HFA;VENTOLIN HFA) 108 (90 Base) MCG/ACT inhaler Inhale into the lungs. 07/03/16   [provider]  atorvastatin (LIPITOR) 20 MG tablet Take 20 mg by mouth daily.    [provider]  cetirizine (ZYRTEC) 10 MG tablet Take 10 mg by mouth daily.     [provider]  FLUoxetine (PROZAC) 10 MG tablet Take by mouth. 12/24/17   [provider]  Fluoxetine HCl, PMDD, (SARAFEM) 10 MG TABS Take by mouth.    [provider]  furosemide (LASIX) 20 MG tablet Take 1 tablet (20 mg total) by mouth daily. 04/01/18   Horton, Barbette Hair, MD  gabapentin (NEURONTIN) 300 MG capsule Take 300 mg by mouth 3 (three) times daily.    [provider]  HYDROcodone-acetaminophen (NORCO/VICODIN) 5-325 MG tablet Take 1 tablet by mouth every 6 (six) hours as needed. 01/13/19   Muthersbaugh, Jarrett Soho, PA-C  levothyroxine (SYNTHROID, LEVOTHROID) 200 MCG tablet Take 200 mcg by mouth daily before breakfast.    [provider]  lisinopril (PRINIVIL,ZESTRIL) 10 MG tablet Take 10 mg by mouth daily.    [provider]  metFORMIN (GLUCOPHAGE) 500 MG tablet Take by mouth 2 (two) times daily with a meal.    [provider]  methocarbamol (ROBAXIN) 500 MG tablet Take 1 tablet (500 mg total) by mouth 2 (two) times daily. 10/06/19   Corena Herter, PA-C  Omega-3 Fatty Acids (FISH OIL) 1000 MG CPDR Take 2 capsules by mouth daily.    [provider]  predniSONE (DELTASONE) 50 MG tablet Take 1 tablet (50 mg total) by mouth daily. 10/16/18   Tegeler, Gwenyth Allegra, MD  traZODone (  DESYREL) 100 MG tablet Take 100 mg by mouth at bedtime.    [provider]    Family History No family history on file.  Social History Social History   Tobacco Use  . Smoking status: Current Every Day Smoker    Packs/day: 0.50    Types: Cigarettes  . Smokeless tobacco: Never Used  Substance Use Topics  . Alcohol use: No  . Drug use: No     Allergies   Ciprofloxacin hcl, Doxycycline, Promethazine, Promethazine hcl, Ibuprofen, Limonene, Other, Cefdinir, Iodinated diagnostic agents, and Ciprofloxacin   Review of Systems Review of Systems  Genitourinary: Negative for dysuria and vaginal discharge.  Musculoskeletal: Positive  for back pain.  Skin: Negative for color change.  Neurological: Negative for dizziness, weakness and numbness.     Physical Exam Updated Vital Signs BP (!) 145/75 (BP Location: Right Arm)   Pulse 95   Temp 98 F (36.7 C)   Resp 16   Ht 5\' 7"  (1.702 m)   Wt (!) 146.5 kg   SpO2 95%   BMI 50.59 kg/m   Physical Exam Vitals signs and nursing note reviewed. Exam conducted with a chaperone present.  Constitutional:      Appearance: Normal appearance.  HENT:     Head: Normocephalic and atraumatic.  Eyes:     General: No scleral icterus.    Conjunctiva/sclera: Conjunctivae normal.  Cardiovascular:     Rate and Rhythm: Normal rate and regular rhythm.     Pulses: Normal pulses.  Pulmonary:     Effort: Pulmonary effort is normal. No respiratory distress.     Breath sounds: Normal breath sounds.  Musculoskeletal:     Comments: TTP over right and left lumbar region.  No midline spinal tenderness to palpation.  No obvious bony deformities.  No overlying erythema, ecchymoses, or other skin changes.  Distal pulses intact.  Skin:    General: Skin is dry.  Neurological:     General: No focal deficit present.     Mental Status: She is alert and oriented to person, place, and time.     GCS: GCS eye subscore is 4. GCS verbal subscore is 5. GCS motor subscore is 6.     Cranial Nerves: No cranial nerve deficit.     Sensory: No sensory deficit.     Comments: Antalgic gait.  No ataxia.  Psychiatric:        Mood and Affect: Mood normal.        Behavior: Behavior normal.        Thought Content: Thought content normal.      ED Treatments / Results  Labs (all labs ordered are listed, but only abnormal results are displayed) Labs Reviewed - No data to display  EKG None  Radiology No results found.  Procedures Procedures (including critical care time)  Medications Ordered in ED Medications - No data to display   Initial Impression / Assessment and Plan / ED Course  I have  reviewed the triage vital signs and the nursing notes.  Pertinent labs & imaging results that were available during my care of the patient were reviewed by me and considered in my medical decision making (see chart for details).        Patient has history of physical exam consistent with a lumbar strain with intermittent unilateral radicular symptoms.  Patient reports that she has a history of low back pain with sciatica and this feels similar.  She had a clear mechanism of injury and  denies any IVDA, fever or chills, personal history of cancer, or any other alarming red flag symptoms or history.  Patient states that her discomfort was initially on the left side, but she has been compensating which is why her right lumbar region is now also tender to palpation.  She reports that she has impaired renal function, but that PCP allows her to take Advil, as needed.  I encouraged her to continue taking the Tylenol, as prescribed, and I will add on muscle relaxer for her spasms.  Encouraged light stretching exercises as tolerated.  Will refer to orthopedics as well as to PT for further evaluation and management of her acute on chronic low back pain with sciatic symptoms.  Discussed the option of obtaining a lumbar plain films, but patient has no bony tenderness to palpation and is able to ambulate.  Engaged in mutual decision making patient with rather try muscle relaxer and follow-up with orthopedics for further evaluation and management.   Final Clinical Impressions(s) / ED Diagnoses   Final diagnoses:  Strain of lumbar region, initial encounter    ED Discharge Orders         Ordered    methocarbamol (ROBAXIN) 500 MG tablet  2 times daily     10/06/19 1706           Elvera MariaGreen, Jendayi Berling L, PA-C 10/06/19 1707    Linwood DibblesKnapp, Jon, MD 10/06/19 2329

## 2019-12-06 ENCOUNTER — Encounter (HOSPITAL_BASED_OUTPATIENT_CLINIC_OR_DEPARTMENT_OTHER): Payer: Self-pay

## 2019-12-06 ENCOUNTER — Emergency Department (HOSPITAL_BASED_OUTPATIENT_CLINIC_OR_DEPARTMENT_OTHER): Payer: Medicaid Other

## 2019-12-06 ENCOUNTER — Emergency Department (HOSPITAL_BASED_OUTPATIENT_CLINIC_OR_DEPARTMENT_OTHER)
Admission: EM | Admit: 2019-12-06 | Discharge: 2019-12-06 | Disposition: A | Discharge status: Home / Self Care | Payer: Medicaid Other | Attending: Emergency Medicine | Admitting: Emergency Medicine

## 2019-12-06 ENCOUNTER — Other Ambulatory Visit: Payer: Self-pay

## 2019-12-06 DIAGNOSIS — R0902 Hypoxemia: Secondary | ICD-10-CM | POA: Diagnosis not present

## 2019-12-06 DIAGNOSIS — E119 Type 2 diabetes mellitus without complications: Secondary | ICD-10-CM | POA: Diagnosis not present

## 2019-12-06 DIAGNOSIS — F17210 Nicotine dependence, cigarettes, uncomplicated: Secondary | ICD-10-CM | POA: Diagnosis not present

## 2019-12-06 DIAGNOSIS — R0602 Shortness of breath: Secondary | ICD-10-CM | POA: Diagnosis present

## 2019-12-06 DIAGNOSIS — Z881 Allergy status to other antibiotic agents status: Secondary | ICD-10-CM | POA: Insufficient documentation

## 2019-12-06 DIAGNOSIS — J45909 Unspecified asthma, uncomplicated: Secondary | ICD-10-CM | POA: Insufficient documentation

## 2019-12-06 DIAGNOSIS — Z79899 Other long term (current) drug therapy: Secondary | ICD-10-CM | POA: Diagnosis not present

## 2019-12-06 DIAGNOSIS — R791 Abnormal coagulation profile: Secondary | ICD-10-CM | POA: Insufficient documentation

## 2019-12-06 DIAGNOSIS — I10 Essential (primary) hypertension: Secondary | ICD-10-CM | POA: Insufficient documentation

## 2019-12-06 DIAGNOSIS — R7989 Other specified abnormal findings of blood chemistry: Secondary | ICD-10-CM

## 2019-12-06 DIAGNOSIS — Z888 Allergy status to other drugs, medicaments and biological substances status: Secondary | ICD-10-CM | POA: Diagnosis not present

## 2019-12-06 DIAGNOSIS — E782 Mixed hyperlipidemia: Secondary | ICD-10-CM | POA: Diagnosis not present

## 2019-12-06 DIAGNOSIS — Z7984 Long term (current) use of oral hypoglycemic drugs: Secondary | ICD-10-CM | POA: Insufficient documentation

## 2019-12-06 DIAGNOSIS — R071 Chest pain on breathing: Secondary | ICD-10-CM

## 2019-12-06 DIAGNOSIS — E079 Disorder of thyroid, unspecified: Secondary | ICD-10-CM | POA: Insufficient documentation

## 2019-12-06 DIAGNOSIS — R059 Cough, unspecified: Secondary | ICD-10-CM

## 2019-12-06 DIAGNOSIS — R05 Cough: Secondary | ICD-10-CM

## 2019-12-06 DIAGNOSIS — U071 COVID-19: Secondary | ICD-10-CM | POA: Diagnosis not present

## 2019-12-06 LAB — CBC WITH DIFFERENTIAL/PLATELET
Abs Immature Granulocytes: 0.14 10*3/uL — ABNORMAL HIGH (ref 0.00–0.07)
Basophils Absolute: 0.1 10*3/uL (ref 0.0–0.1)
Basophils Relative: 1 %
Eosinophils Absolute: 0.3 10*3/uL (ref 0.0–0.5)
Eosinophils Relative: 3 %
HCT: 42.7 % (ref 36.0–46.0)
Hemoglobin: 14 g/dL (ref 12.0–15.0)
Immature Granulocytes: 2 %
Lymphocytes Relative: 19 %
Lymphs Abs: 1.6 10*3/uL (ref 0.7–4.0)
MCH: 31.5 pg (ref 26.0–34.0)
MCHC: 32.8 g/dL (ref 30.0–36.0)
MCV: 96 fL (ref 80.0–100.0)
Monocytes Absolute: 0.5 10*3/uL (ref 0.1–1.0)
Monocytes Relative: 6 %
Neutro Abs: 6 10*3/uL (ref 1.7–7.7)
Neutrophils Relative %: 69 %
Platelets: 183 10*3/uL (ref 150–400)
RBC: 4.45 MIL/uL (ref 3.87–5.11)
RDW: 16.2 % — ABNORMAL HIGH (ref 11.5–15.5)
WBC: 8.5 10*3/uL (ref 4.0–10.5)
nRBC: 0 % (ref 0.0–0.2)

## 2019-12-06 LAB — COMPREHENSIVE METABOLIC PANEL
ALT: 54 U/L — ABNORMAL HIGH (ref 0–44)
AST: 194 U/L — ABNORMAL HIGH (ref 15–41)
Albumin: 4.4 g/dL (ref 3.5–5.0)
Alkaline Phosphatase: 117 U/L (ref 38–126)
Anion gap: 12 (ref 5–15)
BUN: 10 mg/dL (ref 6–20)
CO2: 22 mmol/L (ref 22–32)
Calcium: 9 mg/dL (ref 8.9–10.3)
Chloride: 101 mmol/L (ref 98–111)
Creatinine, Ser: 0.88 mg/dL (ref 0.44–1.00)
GFR calc Af Amer: >60 mL/min (ref >60)
GFR calc non Af Amer: >60 mL/min (ref >60)
Glucose, Bld: 148 mg/dL — ABNORMAL HIGH (ref 70–99)
Potassium: 3.6 mmol/L (ref 3.5–5.1)
Sodium: 135 mmol/L (ref 135–145)
Total Bilirubin: 0.7 mg/dL (ref 0.3–1.2)
Total Protein: 8.2 g/dL — ABNORMAL HIGH (ref 6.5–8.1)

## 2019-12-06 LAB — LACTIC ACID, PLASMA: Lactic Acid, Venous: 1.4 mmol/L (ref 0.5–1.9)

## 2019-12-06 LAB — SARS CORONAVIRUS 2 AG (30 MIN TAT): SARS Coronavirus 2 Ag: POSITIVE — AB

## 2019-12-06 LAB — BRAIN NATRIURETIC PEPTIDE: B Natriuretic Peptide: 26.9 pg/mL (ref 0.0–100.0)

## 2019-12-06 LAB — TROPONIN I (HIGH SENSITIVITY): Troponin I (High Sensitivity): 6 ng/L (ref <18)

## 2019-12-06 LAB — D-DIMER, QUANTITATIVE: D-Dimer, Quant: 1.2 ug/mL-FEU — ABNORMAL HIGH (ref 0.00–0.50)

## 2019-12-06 MED ORDER — ALBUTEROL SULFATE (2.5 MG/3ML) 0.083% IN NEBU: 5.0000 mg | INHALATION_SOLUTION | Freq: Once | RESPIRATORY_TRACT | Status: DC

## 2019-12-06 MED ORDER — PREDNISONE 50 MG PO TABS: 50.0000 mg | ORAL_TABLET | Freq: Every day | ORAL | 0 refills | Status: DC

## 2019-12-06 MED ORDER — ALBUTEROL SULFATE HFA 108 (90 BASE) MCG/ACT IN AERS
2.0000 | INHALATION_SPRAY | Freq: Once | RESPIRATORY_TRACT | Status: AC
  Administered 2019-12-06: 16:00:00 2 via RESPIRATORY_TRACT
  Filled 2019-12-06: qty 6.7

## 2019-12-06 NOTE — ED Triage Notes (Signed)
Pt states that she thinks she may have bronchitis, reports that she has been coughing with exertional SOB. Also c/o otalgia.

## 2019-12-06 NOTE — Discharge Instructions (Signed)
Your work-up today confirmed COVID-19 infection as likely cause of your shortness of breath, cough, and chest pain.  Your x-ray did show evidence of opacity which likely fits with the viral infection.  You were hypoxic with oxygen saturations dropping into the mid 80s however you did not want admission at this time.  We also had a positive D-dimer discovered but you refused CT scan due to your difficulty with the contrast and did not want to be transferred to another facility for further imaging.  We discussed that we could miss a small blood clot however you felt this was acceptable risk to let you go home.  Due to the faint wheezing and your history of asthma, we will give you the inhaler as well as a burst of steroids for several days.  Please rest and stay hydrated.  Your other tests were overall reassuring.  Please follow-up with your primary doctor and isolate for Covid.  If any symptoms change or worsen, please return to the nearest emergency department.

## 2019-12-06 NOTE — ED Provider Notes (Signed)
[facility] EMERGENCY DEPARTMENT Provider Note   CSN: [id number] Arrival date & time: 12/06/19  1229     History Chief Complaint  Patient presents with   Shortness of Breath   Cough    Melissa Rollins is a 39 y.o. female.  The history is provided by the patient and medical records. No language interpreter was used.  Cough Cough characteristics:  Non-productive Severity:  Severe Onset quality:  Gradual Duration:  4 days Timing:  Constant Progression:  Unchanged Chronicity:  Recurrent Relieved by:  Nothing Worsened by:  Nothing Associated symptoms: chest pain, rhinorrhea and shortness of breath   Associated symptoms: no chills, no diaphoresis, no fever, no headaches, no rash, no sore throat and no wheezing        Past Medical History:  Diagnosis Date   Asthma    Diabetes mellitus without complication (HCC)    Hypercholesterolemia    Hypertension    Morbid obesity (HCC)    PTSD (post-traumatic stress disorder)    Seasonal allergies    Thyroid disease     There are no problems to display for this patient.   Past Surgical History:  Procedure Laterality Date   ABDOMINAL HYSTERECTOMY     CHOLECYSTECTOMY     THYROIDECTOMY       OB History   No obstetric history on file.     History reviewed. No pertinent family history.  Social History   Tobacco Use   Smoking status: Current Every Day Smoker    Packs/day: 0.50    Types: Cigarettes   Smokeless tobacco: Never Used  Substance Use Topics   Alcohol use: No   Drug use: No    Home Medications Prior to Admission medications   Medication Sig Start Date End Date Taking? Authorizing Provider  levothyroxine (SYNTHROID) 200 MCG tablet take 1 tablet by mouth once daily 12/24/17  Yes [provider]  metFORMIN (GLUCOPHAGE) 500 MG tablet Take by mouth. 01/16/18  Yes [provider]  albuterol (PROVENTIL HFA;VENTOLIN HFA) 108 (90 Base) MCG/ACT inhaler Inhale into the lungs.  07/03/16   [provider]  atorvastatin (LIPITOR) 20 MG tablet Take 20 mg by mouth daily.    [provider]  cetirizine (ZYRTEC) 10 MG tablet Take 10 mg by mouth daily.    [provider]  cyclobenzaprine (FLEXERIL) 10 MG tablet Take 1 tablet (10 mg total) by mouth 2 (two) times daily as needed for muscle spasms. 10/06/19   Lorelee NewGreen, Garrett L, PA-C  FLUoxetine (PROZAC) 10 MG tablet Take by mouth. 12/24/17   [provider]  Fluoxetine HCl, PMDD, (SARAFEM) 10 MG TABS Take by mouth.    [provider]  furosemide (LASIX) 20 MG tablet Take 1 tablet (20 mg total) by mouth daily. 04/01/18   Horton, Mayer Maskerourtney F, MD  gabapentin (NEURONTIN) 300 MG capsule Take 300 mg by mouth 3 (three) times daily.    [provider]  HYDROcodone-acetaminophen (NORCO/VICODIN) 5-325 MG tablet Take 1 tablet by mouth every 6 (six) hours as needed. 01/13/19   Muthersbaugh, Dahlia ClientHannah, PA-C  levothyroxine (SYNTHROID, LEVOTHROID) 200 MCG tablet Take 200 mcg by mouth daily before breakfast.    [provider]  lisinopril (PRINIVIL,ZESTRIL) 10 MG tablet Take 10 mg by mouth daily.    [provider]  metFORMIN (GLUCOPHAGE) 500 MG tablet Take by mouth 2 (two) times daily with a meal.    [provider]  Omega-3 Fatty Acids (FISH OIL) 1000 MG CPDR Take 2 capsules by  mouth daily.    [provider]  predniSONE (DELTASONE) 50 MG tablet Take 1 tablet (50 mg total) by mouth daily. 10/16/18   Meghana Tullo, Canary Brim, MD  rosuvastatin (CRESTOR) 40 MG tablet TAKE 1 2 (ONE HALF) TABLET BY MOUTH IN THE EVENING (INSTEAD OF LIPITOR) 06/27/19   [provider]  traZODone (DESYREL) 100 MG tablet Take 100 mg by mouth at bedtime.    [provider]    Allergies    Ciprofloxacin hcl, Doxycycline, Promethazine, Promethazine hcl, Ibuprofen, Limonene, Other, Cefdinir, Iodinated diagnostic agents, and Ciprofloxacin  Review of Systems   Review of Systems    Constitutional: Negative for chills, diaphoresis, fatigue and fever.  HENT: Positive for rhinorrhea. Negative for sore throat.   Respiratory: Positive for cough, chest tightness and shortness of breath. Negative for wheezing and stridor.   Cardiovascular: Positive for chest pain.  Gastrointestinal: Positive for diarrhea. Negative for abdominal pain, constipation, nausea and vomiting.  Genitourinary: Negative for dysuria, flank pain and frequency.  Musculoskeletal: Negative for back pain, neck pain and neck stiffness.  Skin: Negative for rash and wound.  Neurological: Negative for dizziness, light-headedness and headaches.  Psychiatric/Behavioral: Negative for agitation and confusion.  All other systems reviewed and are negative.   Physical Exam Updated Vital Signs BP (!) 126/110 (BP Location: Left Arm)    Pulse 82    Temp 98.7 F (37.1 C) (Oral)    Resp (!) 24    Ht 5\' 7"  (1.702 m)    Wt (!) 155.6 kg    SpO2 92%    BMI 53.72 kg/m   Physical Exam Vitals and nursing note reviewed.  Constitutional:      General: She is not in acute distress.    Appearance: She is well-developed. She is not ill-appearing, toxic-appearing or diaphoretic.  HENT:     Head: Normocephalic and atraumatic.     Right Ear: External ear normal.     Left Ear: External ear normal.     Nose: Nose normal.     Mouth/Throat:     Pharynx: No pharyngeal swelling or oropharyngeal exudate.  Eyes:     Conjunctiva/sclera: Conjunctivae normal.     Pupils: Pupils are equal, round, and reactive to light.  Cardiovascular:     Rate and Rhythm: Normal rate.  Pulmonary:     Effort: Tachypnea present. No respiratory distress.     Breath sounds: No stridor. Rhonchi present. No decreased breath sounds, wheezing or rales.  Chest:     Chest wall: No tenderness.  Abdominal:     General: There is no distension.     Tenderness: There is no abdominal tenderness. There is no rebound.  Musculoskeletal:     Cervical back: Normal  range of motion and neck supple.     Right lower leg: No tenderness. No edema.     Left lower leg: No tenderness. No edema.  Skin:    General: Skin is warm.     Capillary Refill: Capillary refill takes less than 2 seconds.     Findings: No erythema or rash.  Neurological:     General: No focal deficit present.     Mental Status: She is alert and oriented to person, place, and time.     Motor: No abnormal muscle tone.     Coordination: Coordination normal.     Deep Tendon Reflexes: Reflexes are normal and symmetric.  Psychiatric:        Mood and Affect: Mood normal.  ED Results / Procedures / Treatments   Labs (all labs ordered are listed, but only abnormal results are displayed) Labs Reviewed  SARS CORONAVIRUS 2 AG (30 MIN TAT) - Abnormal; Notable for the following components:      Result Value   SARS Coronavirus 2 Ag POSITIVE (*)    All other components within normal limits  CBC WITH DIFFERENTIAL/PLATELET - Abnormal; Notable for the following components:   RDW 16.2 (*)    Abs Immature Granulocytes 0.14 (*)    All other components within normal limits  COMPREHENSIVE METABOLIC PANEL - Abnormal; Notable for the following components:   Glucose, Bld 148 (*)    Total Protein 8.2 (*)    AST 194 (*)    ALT 54 (*)    All other components within normal limits  D-DIMER, QUANTITATIVE (NOT AT [facility]) - Abnormal; Notable for the following components:   D-Dimer, Quant 1.20 (*)    All other components within normal limits  CULTURE, BLOOD (ROUTINE X 2)  CULTURE, BLOOD (ROUTINE X 2)  LACTIC ACID, PLASMA  BRAIN NATRIURETIC PEPTIDE  LACTIC ACID, PLASMA  TROPONIN I (HIGH SENSITIVITY)    EKG EKG Interpretation  Date/Time:  Saturday December 06 2019 12:54:43 EST Ventricular Rate:  86 PR Interval:  160 QRS Duration: 100 QT Interval:  366 QTC Calculation: 437 R Axis:   3 Text Interpretation: Normal sinus rhythm Cannot rule out Anterior infarct , age undetermined Abnormal ECG No prior  ECG for comparison. No STEMI Confirmed by Theda Belfast ([id number]) on 12/06/2019 1:36:47 PM   Radiology DG Chest Portable 1 View  Result Date: 12/06/2019 CLINICAL DATA:  Cough and shortness of breath EXAM: PORTABLE CHEST 1 VIEW COMPARISON:  December 24, 2018 FINDINGS: There is airspace consolidation in the left lower lung region consistent with pneumonia. Lungs elsewhere clear. Heart is upper normal in size with pulmonary vascularity normal. No adenopathy. No bone lesions. IMPRESSION: Consolidation consistent with pneumonia left lower lung region. Lungs elsewhere clear. Heart upper normal in size. No evident adenopathy. Electronically Signed   By: Bretta Bang III M.D.   On: 12/06/2019 13:17    Procedures Procedures (including critical care time)  Medications Ordered in ED Medications  albuterol (VENTOLIN HFA) 108 (90 Base) MCG/ACT inhaler 2 puff (has no administration in time range)    ED Course  I have reviewed the triage vital signs and the nursing notes.  Pertinent labs & imaging results that were available during my care of the patient were reviewed by me and considered in my medical decision making (see chart for details).    MDM Rules/Calculators/A&P                      Harneet Noblett is a 39 y.o. female with a past medical history significant for hypertension, diabetes, asthma, frequent bronchitis, hypercholesterolemia, thyroid disease, PTSD, and obesity who presents with chest pain, shortness of breath, cough, and malaise.  Patient reports that for the last 4 days she is having the symptoms.  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  She says that she has not had fevers or chills but does feel fatigued.  She reports some general malaise.  She reports that she is having severe pain on both lateral sides of her chest specifically with deep breathing and coughing.  She reports the shortness  breath is exertional.  She denies any  new leg pain or leg swelling but reports that she has chronic edema.  Chart review shows that she does take Lasix.  I do not see a history of CHF in the chart.  She denies any constipation or urinary symptoms does report diarrhea today.  She reports the chest pain is sharp and tightness.  It does not radiate but does seem to encircle her chest with discomfort.  She denies history of DVT or PE.  On exam, lungs had some faint rhonchi and minimal wheezing on my exam.  Also faint crackles.  Chest tender on the lateral areas.  No murmur.  Legs are edematous but symmetric.  Legs nontender.  Abdomen nontender.  Patient did have some rhinorrhea and congestion on exam.  Given the ongoing pandemic, I am concerned about possible Covid.  We will get the rapid test and PCR test following if negative.  Will get chest x-ray and labs as well as BNP with her edema, troponin with a chest discomfort, and D-dimer given the tachypnea and pleuritic chest discomfort.  EKG showed no STEMI.   On my exam, oxygen saturation was around 91% on room air.  Will ambulate with pulse ox to see if he gets hypoxic as this may determine disposition.  Anticipate reassessment after work-up.  3:35 PM Patient's work-up began to return.  Covid test was positive.  Troponin and BNP normal.  No leukocytosis, no anemia.  Elevated AST and ALT which patient reports she is being followed by her PCP for.  D-dimer was elevated however patient refused CT PE study and did not want to be transferred for VQ scan.  Lactic acid not elevated.  Chest x-ray did show the possible pneumonia however now that she is Covid positive, I suspect is a viral pneumonia.  Patient was ambulated and her oxygen did drop to 88-89% and she was put on 2 L.  When I spoke with patient, she does not want to be admitted and would rather go home.  She was removed from the oxygen and her oxygen remained between 88 and 92.  She does not want to  be admitted.  We offered her transfer and further imaging for possible PE but she does not want this.  She would rather go home.  She requested steroids and inhaler which was provided given her faint wheezing.  Patient will self isolate for COVID-19 infection and follow-up with her PCP.  She had no other questions or concerns and will return if any symptoms change or worsen.  Patient discharged in stable condition with mild hypoxia.    Final Clinical Impression(s) / ED Diagnoses Final diagnoses:  Cough  Shortness of breath  Chest pain on breathing  COVID-19  Hypoxia  Positive D dimer    Rx / DC Orders ED Discharge Orders         Ordered    predniSONE (DELTASONE) 50 MG tablet  Daily     12/06/19 1544          Clinical Impression: 1. COVID-19   2. Cough   3. Shortness of breath   4. Chest pain on breathing   5. Hypoxia   6. Positive D dimer     Disposition: Discharge  Condition: Good  I have discussed the results, Dx and Tx plan with the pt(& family if present). He/she/they expressed understanding and agree(s) with the plan. Discharge instructions discussed at great length. Strict return precautions discussed and pt &/or family have verbalized understanding of the instructions. No further  questions at time of discharge.    New Prescriptions   PREDNISONE (DELTASONE) 50 MG TABLET    Take 1 tablet (50 mg total) by mouth daily.    Follow Up: [place] [address]  [place] [address] [phone]     [facility] [address] [id number] [facility] [place] [phone]       Rameses Ou, Gwenyth Allegra, MD 12/06/19 [phone]

## 2019-12-06 NOTE — ED Notes (Signed)
Pt ambulated around bedside- SpO2 dropped to 90%. Pt assisted back to bed and remained at 90%. 2L nasal cannula applied. Pt tolerated well.

## 2019-12-06 NOTE — ED Notes (Signed)
Pt remains at 92% with 2L nasal cannula- increased to 3L and pt SpO2 increased to 94.

## 2019-12-06 NOTE — ED Notes (Signed)
Urine specimen in lab if needed.

## 2019-12-06 NOTE — ED Notes (Signed)
Pt placed on cardiac monitor 

## 2019-12-07 ENCOUNTER — Telehealth: Payer: Self-pay | Admitting: Unknown Physician Specialty

## 2019-12-07 NOTE — Telephone Encounter (Signed)
Discussed with patient about Covid symptoms and the use of bamlanivimab, a monoclonal antibody infusion for those with mild to moderate Covid symptoms and at a high risk of hospitalization.  Pt is qualified not qualified for this infusion as she has severe symptoms with hypoxia and symptoms >9 days next infusion date

## 2019-12-11 ENCOUNTER — Emergency Department (HOSPITAL_BASED_OUTPATIENT_CLINIC_OR_DEPARTMENT_OTHER)
Admission: EM | Admit: 2019-12-11 | Discharge: 2019-12-11 | Disposition: A | Discharge status: Home / Self Care | Payer: Medicaid Other | Attending: Emergency Medicine | Admitting: Emergency Medicine

## 2019-12-11 ENCOUNTER — Other Ambulatory Visit: Payer: Self-pay

## 2019-12-11 ENCOUNTER — Emergency Department (HOSPITAL_BASED_OUTPATIENT_CLINIC_OR_DEPARTMENT_OTHER): Payer: Medicaid Other

## 2019-12-11 ENCOUNTER — Encounter (HOSPITAL_BASED_OUTPATIENT_CLINIC_OR_DEPARTMENT_OTHER): Payer: Self-pay | Admitting: *Deleted

## 2019-12-11 DIAGNOSIS — E119 Type 2 diabetes mellitus without complications: Secondary | ICD-10-CM | POA: Diagnosis not present

## 2019-12-11 DIAGNOSIS — Z7984 Long term (current) use of oral hypoglycemic drugs: Secondary | ICD-10-CM | POA: Diagnosis not present

## 2019-12-11 DIAGNOSIS — N3000 Acute cystitis without hematuria: Secondary | ICD-10-CM | POA: Insufficient documentation

## 2019-12-11 DIAGNOSIS — K625 Hemorrhage of anus and rectum: Secondary | ICD-10-CM

## 2019-12-11 DIAGNOSIS — R109 Unspecified abdominal pain: Secondary | ICD-10-CM

## 2019-12-11 DIAGNOSIS — F17210 Nicotine dependence, cigarettes, uncomplicated: Secondary | ICD-10-CM | POA: Insufficient documentation

## 2019-12-11 DIAGNOSIS — J45909 Unspecified asthma, uncomplicated: Secondary | ICD-10-CM | POA: Insufficient documentation

## 2019-12-11 DIAGNOSIS — R1011 Right upper quadrant pain: Secondary | ICD-10-CM | POA: Diagnosis present

## 2019-12-11 DIAGNOSIS — Z79899 Other long term (current) drug therapy: Secondary | ICD-10-CM | POA: Insufficient documentation

## 2019-12-11 LAB — CULTURE, BLOOD (ROUTINE X 2)
Culture: NO GROWTH
Culture: NO GROWTH
Special Requests: ADEQUATE

## 2019-12-11 LAB — URINALYSIS, ROUTINE W REFLEX MICROSCOPIC
Bilirubin Urine: NEGATIVE
Glucose, UA: NEGATIVE mg/dL
Ketones, ur: NEGATIVE mg/dL
Nitrite: POSITIVE — AB
Protein, ur: >300 mg/dL — AB
Specific Gravity, Urine: 1.025 (ref 1.005–1.030)
pH: 6.5 (ref 5.0–8.0)

## 2019-12-11 LAB — BASIC METABOLIC PANEL
Anion gap: 12 (ref 5–15)
BUN: 16 mg/dL (ref 6–20)
CO2: 24 mmol/L (ref 22–32)
Calcium: 9.3 mg/dL (ref 8.9–10.3)
Chloride: 102 mmol/L (ref 98–111)
Creatinine, Ser: 0.82 mg/dL (ref 0.44–1.00)
GFR calc Af Amer: >60 mL/min (ref >60)
GFR calc non Af Amer: >60 mL/min (ref >60)
Glucose, Bld: 142 mg/dL — ABNORMAL HIGH (ref 70–99)
Potassium: 3.6 mmol/L (ref 3.5–5.1)
Sodium: 138 mmol/L (ref 135–145)

## 2019-12-11 LAB — CBC WITH DIFFERENTIAL/PLATELET
Abs Immature Granulocytes: 0.78 10*3/uL — ABNORMAL HIGH (ref 0.00–0.07)
Basophils Absolute: 0.2 10*3/uL — ABNORMAL HIGH (ref 0.0–0.1)
Basophils Relative: 2 %
Eosinophils Absolute: 0.1 10*3/uL (ref 0.0–0.5)
Eosinophils Relative: 1 %
HCT: 42.1 % (ref 36.0–46.0)
Hemoglobin: 13.7 g/dL (ref 12.0–15.0)
Immature Granulocytes: 8 %
Lymphocytes Relative: 28 %
Lymphs Abs: 2.8 10*3/uL (ref 0.7–4.0)
MCH: 31.1 pg (ref 26.0–34.0)
MCHC: 32.5 g/dL (ref 30.0–36.0)
MCV: 95.7 fL (ref 80.0–100.0)
Monocytes Absolute: 0.7 10*3/uL (ref 0.1–1.0)
Monocytes Relative: 7 %
Neutro Abs: 5.5 10*3/uL (ref 1.7–7.7)
Neutrophils Relative %: 54 %
Platelets: 331 10*3/uL (ref 150–400)
RBC: 4.4 MIL/uL (ref 3.87–5.11)
RDW: 15.9 % — ABNORMAL HIGH (ref 11.5–15.5)
WBC: 10.1 10*3/uL (ref 4.0–10.5)
nRBC: 0.2 % (ref 0.0–0.2)

## 2019-12-11 LAB — URINALYSIS, MICROSCOPIC (REFLEX)
RBC / HPF: >50 RBC/hpf (ref 0–5)
WBC, UA: >50 WBC/hpf (ref 0–5)

## 2019-12-11 LAB — PREGNANCY, URINE: Preg Test, Ur: NEGATIVE

## 2019-12-11 MED ORDER — SULFAMETHOXAZOLE-TRIMETHOPRIM 800-160 MG PO TABS
1.0000 | ORAL_TABLET | Freq: Once | ORAL | Status: AC
  Administered 2019-12-11: 1 via ORAL
  Filled 2019-12-11: qty 1

## 2019-12-11 MED ORDER — KETOROLAC TROMETHAMINE 30 MG/ML IJ SOLN
30.0000 mg | Freq: Once | INTRAMUSCULAR | Status: AC
  Administered 2019-12-11: 09:00:00 30 mg via INTRAVENOUS
  Filled 2019-12-11: qty 1

## 2019-12-11 MED ORDER — TRAMADOL HCL 50 MG PO TABS: 50.0000 mg | ORAL_TABLET | Freq: Four times a day (QID) | ORAL | 0 refills | Status: DC | PRN

## 2019-12-11 MED ORDER — MORPHINE SULFATE (PF) 4 MG/ML IV SOLN
4.0000 mg | Freq: Once | INTRAVENOUS | Status: AC
  Administered 2019-12-11: 09:00:00 4 mg via INTRAVENOUS
  Filled 2019-12-11: qty 1

## 2019-12-11 MED ORDER — SULFAMETHOXAZOLE-TRIMETHOPRIM 800-160 MG PO TABS: 1.0000 | ORAL_TABLET | Freq: Two times a day (BID) | ORAL | 0 refills | Status: DC

## 2019-12-11 MED ORDER — ONDANSETRON HCL 4 MG/2ML IJ SOLN
4.0000 mg | Freq: Once | INTRAMUSCULAR | Status: AC
  Administered 2019-12-11: 4 mg via INTRAVENOUS
  Filled 2019-12-11: qty 2

## 2019-12-11 NOTE — Discharge Instructions (Addendum)
Begin taking Bactrim as prescribed.  Tramadol as prescribed as needed for pain.  Begin using Metamucil: 1 heaping teaspoon in a glass of water 3 times daily.  Also begin taking Colace 100 mg twice daily as a stool softener.  Both of these medications are available over-the-counter.  Follow-up with your primary doctor if not improving in the next few days.

## 2019-12-11 NOTE — ED Triage Notes (Signed)
covid sx  Started on 4th on 9th dx w covid  Today c/o burning urge to urinate  Then only dribble,  Has lower abd pain radiating into lower back  Pain is sharper w urination,   Has blood in toilet, states bright red   Thinks she has a hemorrhoid

## 2019-12-11 NOTE — ED Provider Notes (Signed)
[facility] EMERGENCY DEPARTMENT Provider Note   CSN: [id number] Arrival date & time: 12/11/19  3419     History Chief Complaint  Patient presents with  . Abdominal Pain    Melissa Rollins is a 39 y.o. female.  Patient is a 39 year old female with past medical history of diabetes, asthma, hyperlipidemia, hypertension, obesity, and recently diagnosed COVID-19.  She was seen here several days ago for Covid symptoms.  She had a positive test and recommended admission, however declined.  She returns today stating that she is having pain in her right flank radiating into her right groin.  This began abruptly at approximately 2 AM.  She describes blood in her urine and an urge to urinate.  She denies any fevers or chills.  She also believes she may have a hemorrhoid.  She has a sore rectum when she has a bowel movement and reports blood in the toilet and when wiping.  The history is provided by the patient.  Abdominal Pain Pain location:  R flank Pain quality: cramping   Pain radiates to:  RLQ Pain severity:  Moderate Onset quality:  Sudden Duration:  6 hours Timing:  Constant Progression:  Worsening Chronicity:  New Relieved by:  Nothing Worsened by:  Nothing Ineffective treatments:  None tried      Past Medical History:  Diagnosis Date  . Asthma   . Diabetes mellitus without complication ([place])   . Hypercholesterolemia   . Hypertension   . Morbid obesity ([place])   . PTSD (post-traumatic stress disorder)   . Seasonal allergies   . Thyroid disease     There are no problems to display for this patient.   Past Surgical History:  Procedure Laterality Date  . ABDOMINAL HYSTERECTOMY    . CHOLECYSTECTOMY    . THYROIDECTOMY       OB History   No obstetric history on file.     No family history on file.  Social History   Tobacco Use  . Smoking status: Current Every Day Smoker    Packs/day: 0.50    Types: Cigarettes  . Smokeless tobacco: Never Used    Substance Use Topics  . Alcohol use: No  . Drug use: No    Home Medications Prior to Admission medications   Medication Sig Start Date End Date Taking? Authorizing Provider  albuterol (PROVENTIL HFA;VENTOLIN HFA) 108 (90 Base) MCG/ACT inhaler Inhale into the lungs. 07/03/16   [provider]  atorvastatin (LIPITOR) 20 MG tablet Take 20 mg by mouth daily.    [provider]  cetirizine (ZYRTEC) 10 MG tablet Take 10 mg by mouth daily.    [provider]  cyclobenzaprine (FLEXERIL) 10 MG tablet Take 1 tablet (10 mg total) by mouth 2 (two) times daily as needed for muscle spasms. 10/06/19   Corena Herter, PA-C  FLUoxetine (PROZAC) 10 MG tablet Take by mouth. 12/24/17   [provider]  Fluoxetine HCl, PMDD, (SARAFEM) 10 MG TABS Take by mouth.    [provider]  furosemide (LASIX) 20 MG tablet Take 1 tablet (20 mg total) by mouth daily. 04/01/18   Horton, Barbette Hair, MD  gabapentin (NEURONTIN) 300 MG capsule Take 300 mg by mouth 3 (three) times daily.    [provider]  HYDROcodone-acetaminophen (NORCO/VICODIN) 5-325 MG tablet Take 1 tablet by mouth every 6 (six) hours as needed. 01/13/19   Muthersbaugh, Jarrett Soho, PA-C  levothyroxine (SYNTHROID) 200 MCG tablet take 1 tablet by mouth once daily 12/24/17  [provider]  levothyroxine (SYNTHROID, LEVOTHROID) 200 MCG tablet Take 200 mcg by mouth daily before breakfast.    [provider]  lisinopril (PRINIVIL,ZESTRIL) 10 MG tablet Take 10 mg by mouth daily.    [provider]  metFORMIN (GLUCOPHAGE) 500 MG tablet Take by mouth 2 (two) times daily with a meal.    [provider]  metFORMIN (GLUCOPHAGE) 500 MG tablet Take by mouth. 01/16/18   [provider]  Omega-3 Fatty Acids (FISH OIL) 1000 MG CPDR Take 2 capsules by mouth daily.    [provider]  predniSONE (DELTASONE) 50 MG tablet Take 1 tablet (50 mg total) by mouth daily. 10/16/18    Tegeler, Canary Brim, MD  predniSONE (DELTASONE) 50 MG tablet Take 1 tablet (50 mg total) by mouth daily. 12/06/19   Tegeler, Canary Brim, MD  rosuvastatin (CRESTOR) 40 MG tablet TAKE 1 2 (ONE HALF) TABLET BY MOUTH IN THE EVENING (INSTEAD OF LIPITOR) 06/27/19   [provider]  traZODone (DESYREL) 100 MG tablet Take 100 mg by mouth at bedtime.    [provider]    Allergies    Ciprofloxacin hcl, Doxycycline, Promethazine, Promethazine hcl, Ibuprofen, Limonene, Other, Cefdinir, Iodinated diagnostic agents, and Ciprofloxacin  Review of Systems   Review of Systems  Gastrointestinal: Positive for abdominal pain.  All other systems reviewed and are negative.   Physical Exam Updated Vital Signs BP 107/76 (BP Location: Right Arm)   Pulse 94   Temp 98 F (36.7 C) (Oral)   Resp 18   SpO2 91%   Physical Exam Vitals and nursing note reviewed.  Constitutional:      General: She is not in acute distress.    Appearance: She is well-developed. She is not diaphoretic.  HENT:     Head: Normocephalic and atraumatic.  Cardiovascular:     Rate and Rhythm: Normal rate and regular rhythm.     Heart sounds: No murmur. No friction rub. No gallop.   Pulmonary:     Effort: Pulmonary effort is normal. No respiratory distress.     Breath sounds: Normal breath sounds. No wheezing.  Abdominal:     General: Bowel sounds are normal. There is no distension.     Palpations: Abdomen is soft.     Tenderness: There is abdominal tenderness in the right lower quadrant. There is right CVA tenderness. There is no left CVA tenderness, guarding or rebound.  Genitourinary:    Rectum: Normal. No tenderness.     Comments: There are several external hemorrhoids noted, none of which appear significantly inflamed and none of which are bleeding. Musculoskeletal:        General: Normal range of motion.     Cervical back: Normal range of motion and neck supple.  Skin:    General: Skin is warm and  dry.  Neurological:     Mental Status: She is alert and oriented to person, place, and time.     ED Results / Procedures / Treatments   Labs (all labs ordered are listed, but only abnormal results are displayed) Labs Reviewed  URINALYSIS, ROUTINE W REFLEX MICROSCOPIC  BASIC METABOLIC PANEL  CBC WITH DIFFERENTIAL/PLATELET  PREGNANCY, URINE    EKG None  Radiology No results found.  Procedures Procedures (including critical care time)  Medications Ordered in ED Medications  ondansetron (ZOFRAN) injection 4 mg (has no administration in time range)  morphine 4 MG/ML injection 4 mg (has no administration in time range)  ketorolac (TORADOL) 30 MG/ML  injection 30 mg (has no administration in time range)    ED Course  I have reviewed the triage vital signs and the nursing notes.  Pertinent labs & imaging results that were available during my care of the patient were reviewed by me and considered in my medical decision making (see chart for details).    MDM Rules/Calculators/A&P  Patient presenting with complaints of pain in her flank that appears to be related to a urinary tract infection.  CT scan shows no evidence for renal calculus.  Patient will be treated with Bactrim and as needed return.  She is also complaining of rectal pain and bleeding and is concerned she may have a hemorrhoid.  She does have several external hemorrhoids, but none of which appear to be actively inflamed.  I am unable to identify a fissure or another cause.  Either way, her hemoglobin is 13.7 and vitals are stable.  She does not appear to be actively bleeding and I believe is appropriate for discharge.  She had a colonoscopy 1 year ago which was unremarkable.  Final Clinical Impression(s) / ED Diagnoses Final diagnoses:  None    Rx / DC Orders ED Discharge Orders    None       Geoffery Lyons, MD 12/11/19 1021

## 2019-12-15 LAB — PATHOLOGIST SMEAR REVIEW

## 2019-12-18 ENCOUNTER — Inpatient Hospital Stay (HOSPITAL_BASED_OUTPATIENT_CLINIC_OR_DEPARTMENT_OTHER)
Admission: EM | Admit: 2019-12-18 | Discharge: 2019-12-20 | DRG: 871 | Disposition: A | Discharge status: Home / Self Care | Payer: Medicaid Other | Attending: Family Medicine | Admitting: Family Medicine

## 2019-12-18 ENCOUNTER — Emergency Department (HOSPITAL_BASED_OUTPATIENT_CLINIC_OR_DEPARTMENT_OTHER): Payer: Medicaid Other

## 2019-12-18 ENCOUNTER — Other Ambulatory Visit: Payer: Self-pay

## 2019-12-18 ENCOUNTER — Encounter (HOSPITAL_BASED_OUTPATIENT_CLINIC_OR_DEPARTMENT_OTHER): Payer: Self-pay | Admitting: Emergency Medicine

## 2019-12-18 DIAGNOSIS — F4310 Post-traumatic stress disorder, unspecified: Secondary | ICD-10-CM | POA: Diagnosis present

## 2019-12-18 DIAGNOSIS — E7800 Pure hypercholesterolemia, unspecified: Secondary | ICD-10-CM | POA: Diagnosis present

## 2019-12-18 DIAGNOSIS — Z886 Allergy status to analgesic agent status: Secondary | ICD-10-CM | POA: Diagnosis not present

## 2019-12-18 DIAGNOSIS — N390 Urinary tract infection, site not specified: Secondary | ICD-10-CM | POA: Diagnosis present

## 2019-12-18 DIAGNOSIS — I951 Orthostatic hypotension: Secondary | ICD-10-CM | POA: Diagnosis present

## 2019-12-18 DIAGNOSIS — E119 Type 2 diabetes mellitus without complications: Secondary | ICD-10-CM

## 2019-12-18 DIAGNOSIS — Z79890 Hormone replacement therapy (postmenopausal): Secondary | ICD-10-CM

## 2019-12-18 DIAGNOSIS — J9601 Acute respiratory failure with hypoxia: Secondary | ICD-10-CM | POA: Diagnosis present

## 2019-12-18 DIAGNOSIS — Z6841 Body Mass Index (BMI) 40.0 and over, adult: Secondary | ICD-10-CM

## 2019-12-18 DIAGNOSIS — E890 Postprocedural hypothyroidism: Secondary | ICD-10-CM | POA: Diagnosis present

## 2019-12-18 DIAGNOSIS — F17210 Nicotine dependence, cigarettes, uncomplicated: Secondary | ICD-10-CM | POA: Diagnosis present

## 2019-12-18 DIAGNOSIS — Z91041 Radiographic dye allergy status: Secondary | ICD-10-CM | POA: Diagnosis not present

## 2019-12-18 DIAGNOSIS — I10 Essential (primary) hypertension: Secondary | ICD-10-CM | POA: Diagnosis present

## 2019-12-18 DIAGNOSIS — E860 Dehydration: Secondary | ICD-10-CM | POA: Diagnosis present

## 2019-12-18 DIAGNOSIS — A419 Sepsis, unspecified organism: Principal | ICD-10-CM | POA: Diagnosis present

## 2019-12-18 DIAGNOSIS — N3000 Acute cystitis without hematuria: Secondary | ICD-10-CM

## 2019-12-18 DIAGNOSIS — Z888 Allergy status to other drugs, medicaments and biological substances status: Secondary | ICD-10-CM

## 2019-12-18 DIAGNOSIS — E872 Acidosis: Secondary | ICD-10-CM | POA: Diagnosis present

## 2019-12-18 DIAGNOSIS — N179 Acute kidney failure, unspecified: Secondary | ICD-10-CM | POA: Diagnosis present

## 2019-12-18 DIAGNOSIS — J45909 Unspecified asthma, uncomplicated: Secondary | ICD-10-CM | POA: Diagnosis present

## 2019-12-18 DIAGNOSIS — J1282 Pneumonia due to coronavirus disease 2019: Secondary | ICD-10-CM | POA: Diagnosis present

## 2019-12-18 DIAGNOSIS — Z7984 Long term (current) use of oral hypoglycemic drugs: Secondary | ICD-10-CM

## 2019-12-18 DIAGNOSIS — Z881 Allergy status to other antibiotic agents status: Secondary | ICD-10-CM

## 2019-12-18 DIAGNOSIS — E039 Hypothyroidism, unspecified: Secondary | ICD-10-CM | POA: Diagnosis present

## 2019-12-18 DIAGNOSIS — U071 COVID-19: Secondary | ICD-10-CM | POA: Diagnosis present

## 2019-12-18 DIAGNOSIS — E785 Hyperlipidemia, unspecified: Secondary | ICD-10-CM | POA: Diagnosis present

## 2019-12-18 LAB — URINALYSIS, ROUTINE W REFLEX MICROSCOPIC
Glucose, UA: 250 mg/dL — AB
Ketones, ur: 15 mg/dL — AB
Nitrite: POSITIVE — AB
Protein, ur: >300 mg/dL — AB
Specific Gravity, Urine: 1.025 (ref 1.005–1.030)
pH: 5 (ref 5.0–8.0)

## 2019-12-18 LAB — HEPATIC FUNCTION PANEL
ALT: 35 U/L (ref 0–44)
AST: 93 U/L — ABNORMAL HIGH (ref 15–41)
Albumin: 3.6 g/dL (ref 3.5–5.0)
Alkaline Phosphatase: 110 U/L (ref 38–126)
Bilirubin, Direct: 0.7 mg/dL — ABNORMAL HIGH (ref 0.0–0.2)
Indirect Bilirubin: 1 mg/dL — ABNORMAL HIGH (ref 0.3–0.9)
Total Bilirubin: 1.7 mg/dL — ABNORMAL HIGH (ref 0.3–1.2)
Total Protein: 7.5 g/dL (ref 6.5–8.1)

## 2019-12-18 LAB — GLUCOSE, CAPILLARY
Glucose-Capillary: 208 mg/dL — ABNORMAL HIGH (ref 70–99)
Glucose-Capillary: 236 mg/dL — ABNORMAL HIGH (ref 70–99)

## 2019-12-18 LAB — CBC WITH DIFFERENTIAL/PLATELET
Abs Immature Granulocytes: 0.62 10*3/uL — ABNORMAL HIGH (ref 0.00–0.07)
Basophils Absolute: 0.2 10*3/uL — ABNORMAL HIGH (ref 0.0–0.1)
Basophils Relative: 1 %
Eosinophils Absolute: 0.1 10*3/uL (ref 0.0–0.5)
Eosinophils Relative: 1 %
HCT: 34.4 % — ABNORMAL LOW (ref 36.0–46.0)
Hemoglobin: 11.3 g/dL — ABNORMAL LOW (ref 12.0–15.0)
Immature Granulocytes: 4 %
Lymphocytes Relative: 12 %
Lymphs Abs: 2 10*3/uL (ref 0.7–4.0)
MCH: 31.7 pg (ref 26.0–34.0)
MCHC: 32.8 g/dL (ref 30.0–36.0)
MCV: 96.4 fL (ref 80.0–100.0)
Monocytes Absolute: 0.8 10*3/uL (ref 0.1–1.0)
Monocytes Relative: 4 %
Neutro Abs: 13.9 10*3/uL — ABNORMAL HIGH (ref 1.7–7.7)
Neutrophils Relative %: 78 %
Platelets: 195 10*3/uL (ref 150–400)
RBC: 3.57 MIL/uL — ABNORMAL LOW (ref 3.87–5.11)
RDW: 15.9 % — ABNORMAL HIGH (ref 11.5–15.5)
WBC: 17.6 10*3/uL — ABNORMAL HIGH (ref 4.0–10.5)
nRBC: 0.2 % (ref 0.0–0.2)

## 2019-12-18 LAB — URINALYSIS, MICROSCOPIC (REFLEX): WBC, UA: >50 WBC/hpf (ref 0–5)

## 2019-12-18 LAB — C-REACTIVE PROTEIN: CRP: 23.4 mg/dL — ABNORMAL HIGH (ref <1.0)

## 2019-12-18 LAB — PREGNANCY, URINE: Preg Test, Ur: NEGATIVE

## 2019-12-18 LAB — D-DIMER, QUANTITATIVE: D-Dimer, Quant: 1.68 ug/mL-FEU — ABNORMAL HIGH (ref 0.00–0.50)

## 2019-12-18 LAB — ABO/RH: ABO/RH(D): O POS

## 2019-12-18 LAB — BASIC METABOLIC PANEL
Anion gap: 12 (ref 5–15)
BUN: 19 mg/dL (ref 6–20)
CO2: 21 mmol/L — ABNORMAL LOW (ref 22–32)
Calcium: 9.2 mg/dL (ref 8.9–10.3)
Chloride: 101 mmol/L (ref 98–111)
Creatinine, Ser: 1.64 mg/dL — ABNORMAL HIGH (ref 0.44–1.00)
GFR calc Af Amer: 46 mL/min — ABNORMAL LOW (ref >60)
GFR calc non Af Amer: 39 mL/min — ABNORMAL LOW (ref >60)
Glucose, Bld: 171 mg/dL — ABNORMAL HIGH (ref 70–99)
Potassium: 3.6 mmol/L (ref 3.5–5.1)
Sodium: 134 mmol/L — ABNORMAL LOW (ref 135–145)

## 2019-12-18 LAB — LACTIC ACID, PLASMA
Lactic Acid, Venous: 2.1 mmol/L (ref 0.5–1.9)
Lactic Acid, Venous: 1.3 mmol/L (ref 0.5–1.9)
Lactic Acid, Venous: 1.7 mmol/L (ref 0.5–1.9)

## 2019-12-18 MED ORDER — ENOXAPARIN SODIUM 80 MG/0.8ML ~~LOC~~ SOLN
0.5000 mg/kg | SUBCUTANEOUS | Status: DC
  Administered 2019-12-18 – 2019-12-19 (×2): 80 mg via SUBCUTANEOUS
  Filled 2019-12-18 (×2): qty 0.8

## 2019-12-18 MED ORDER — DEXAMETHASONE SODIUM PHOSPHATE 10 MG/ML IJ SOLN
6.0000 mg | Freq: Once | INTRAMUSCULAR | Status: AC
  Administered 2019-12-18: 6 mg via INTRAVENOUS
  Filled 2019-12-18: qty 1

## 2019-12-18 MED ORDER — ACETAMINOPHEN 325 MG PO TABS
650.0000 mg | ORAL_TABLET | Freq: Four times a day (QID) | ORAL | Status: DC | PRN
  Administered 2019-12-19 – 2019-12-20 (×2): 650 mg via ORAL
  Filled 2019-12-18 (×2): qty 2

## 2019-12-18 MED ORDER — LEVOTHYROXINE SODIUM 75 MCG PO TABS
200.0000 ug | ORAL_TABLET | Freq: Every day | ORAL | Status: DC
  Administered 2019-12-19 – 2019-12-20 (×2): 200 ug via ORAL
  Filled 2019-12-18 (×2): qty 2

## 2019-12-18 MED ORDER — ASCORBIC ACID 500 MG PO TABS
500.0000 mg | ORAL_TABLET | Freq: Every day | ORAL | Status: DC
  Administered 2019-12-18 – 2019-12-20 (×3): 500 mg via ORAL
  Filled 2019-12-18 (×3): qty 1

## 2019-12-18 MED ORDER — SODIUM CHLORIDE 0.9% FLUSH
3.0000 mL | Freq: Two times a day (BID) | INTRAVENOUS | Status: DC
  Administered 2019-12-18 – 2019-12-20 (×4): 3 mL via INTRAVENOUS

## 2019-12-18 MED ORDER — MECLIZINE HCL 25 MG PO TABS
25.0000 mg | ORAL_TABLET | Freq: Once | ORAL | Status: AC
  Administered 2019-12-18: 25 mg via ORAL
  Filled 2019-12-18: qty 1

## 2019-12-18 MED ORDER — DIPHENHYDRAMINE HCL 50 MG/ML IJ SOLN
12.5000 mg | Freq: Once | INTRAMUSCULAR | Status: AC
  Administered 2019-12-18: 12.5 mg via INTRAVENOUS
  Filled 2019-12-18: qty 1

## 2019-12-18 MED ORDER — SODIUM CHLORIDE 0.9 % IV SOLN
1.0000 g | Freq: Once | INTRAVENOUS | Status: AC
  Administered 2019-12-18: 1 g via INTRAVENOUS
  Filled 2019-12-18: qty 10

## 2019-12-18 MED ORDER — INSULIN ASPART 100 UNIT/ML ~~LOC~~ SOLN
0.0000 [IU] | Freq: Every day | SUBCUTANEOUS | Status: DC
  Administered 2019-12-18 – 2019-12-19 (×2): 2 [IU] via SUBCUTANEOUS

## 2019-12-18 MED ORDER — ALBUTEROL SULFATE HFA 108 (90 BASE) MCG/ACT IN AERS
2.0000 | INHALATION_SPRAY | RESPIRATORY_TRACT | Status: DC | PRN
  Filled 2019-12-18: qty 6.7

## 2019-12-18 MED ORDER — SODIUM CHLORIDE 0.9 % IV SOLN
200.0000 mg | Freq: Once | INTRAVENOUS | Status: AC
  Administered 2019-12-18: 200 mg via INTRAVENOUS
  Filled 2019-12-18: qty 40

## 2019-12-18 MED ORDER — SODIUM CHLORIDE 0.9 % IV BOLUS
1000.0000 mL | Freq: Once | INTRAVENOUS | Status: AC
  Administered 2019-12-18: 1000 mL via INTRAVENOUS

## 2019-12-18 MED ORDER — ONDANSETRON HCL 4 MG PO TABS: 4.0000 mg | ORAL_TABLET | Freq: Four times a day (QID) | ORAL | Status: DC | PRN

## 2019-12-18 MED ORDER — GUAIFENESIN-DM 100-10 MG/5ML PO SYRP: 10.0000 mL | ORAL_SOLUTION | ORAL | Status: DC | PRN

## 2019-12-18 MED ORDER — ZINC SULFATE 220 (50 ZN) MG PO CAPS
220.0000 mg | ORAL_CAPSULE | Freq: Every day | ORAL | Status: DC
  Administered 2019-12-18 – 2019-12-20 (×3): 220 mg via ORAL
  Filled 2019-12-18 (×3): qty 1

## 2019-12-18 MED ORDER — SODIUM CHLORIDE 0.9 % IV SOLN
2.0000 g | INTRAVENOUS | Status: DC
  Administered 2019-12-19: 2 g via INTRAVENOUS
  Filled 2019-12-18: qty 20

## 2019-12-18 MED ORDER — SODIUM CHLORIDE 0.9 % IV SOLN
100.0000 mg | INTRAVENOUS | Status: DC
  Administered 2019-12-19 – 2019-12-20 (×2): 100 mg via INTRAVENOUS
  Filled 2019-12-18 (×2): qty 20

## 2019-12-18 MED ORDER — ONDANSETRON HCL 4 MG/2ML IJ SOLN: 4.0000 mg | Freq: Four times a day (QID) | INTRAMUSCULAR | Status: DC | PRN

## 2019-12-18 MED ORDER — INSULIN ASPART 100 UNIT/ML ~~LOC~~ SOLN
0.0000 [IU] | Freq: Three times a day (TID) | SUBCUTANEOUS | Status: DC
  Administered 2019-12-19 (×2): 3 [IU] via SUBCUTANEOUS
  Administered 2019-12-19: 1 [IU] via SUBCUTANEOUS
  Administered 2019-12-20: 2 [IU] via SUBCUTANEOUS

## 2019-12-18 MED ORDER — HYDROCOD POLST-CPM POLST ER 10-8 MG/5ML PO SUER: 5.0000 mL | Freq: Two times a day (BID) | ORAL | Status: DC | PRN

## 2019-12-18 NOTE — ED Triage Notes (Addendum)
Dizziness and legs feel funny since last  Night , states cannot walk  Dx covid on 9, states stopped taking her meds for  UTI  3 days ago

## 2019-12-18 NOTE — Progress Notes (Signed)
   12/18/19 1600  Family/Significant Other Communication  Family/Significant Other Update Called;Updated   Updated husband. All questions asked and all questions answered.

## 2019-12-18 NOTE — ED Notes (Signed)
[name], [name]  Ms. Melissa Rollins, receptionist,  physically give patient's purse to her husband.

## 2019-12-18 NOTE — Progress Notes (Addendum)
Notified bedside nurse of need to draw repeat lactic acid. ADD: 1500 Noted pt transfer to [facility].  ADD: 1600 Spoke with bedside RN at [facility]. She said that lab would be drawing the repeat LA.

## 2019-12-18 NOTE — ED Notes (Signed)
Assisted patient to [facility] without any difficulty.  Denies dizziness while she was standing.   She stated that she felt better.  O2 sats is still between 88-91% on room air.

## 2019-12-18 NOTE — ED Notes (Signed)
Attempted to call report for the second time after I gave report to the transfer team.  I asked [place], Diplomatic Services operational officer, if I can speak to the charge nurse but charge nurse is available to take report.  I asked the Secretary to tell the Primary Nurse if she can just read the ED report notes.

## 2019-12-18 NOTE — ED Notes (Signed)
Patient's urine is cloudy, orange.  She stated that she takes AZO OTC for UTI.

## 2019-12-18 NOTE — H&P (Signed)
History and Physical   Melissa Rollins LKG:[id number] DOB: 11/30/1980 DOA: 12/18/2019  Referring MD/NP/PA: Dr. Tomi Bamberger, [address] PCP: Virl Son., MD  Patient coming from: Home by way of [facility]  Chief Complaint: weakness, dizziness  HPI: Melissa Rollins is a 39 y.o. female with a history of obesity, HTN, HLD, T2DM, hypothyroidism, asthma who was recently diagnosed with covid-19  1/9 at [facility]. She presented to [facility] again today for weakness, difficulty standing. She stated cough and shortness of breath had improved on 5 days of prednisone prescribed in the ED 1/9. Of note, she was also seen 1/14 for flank pain found to have +UA with renal stone study showing no urolithiasis, but showing bibasilar infiltrates. She was discharged on bactrim. In the ED she is afebrile with HR 98, BP 103/48 (MAP 63) and orthostatic with SpO2 89-91% at rest, improved with 2L O2. WBC 17.6k, lactic acid 2.1. Cr 1.64 (from 0.82). UA demonstrates nitrite-positive bacteriuria w/ >50WBC/HPF. CXR continues to show bibasilar airspace opacities. CT head due to dizziness was normal.  Given ceftriaxone, 2L IVF. Admission requested for sepsis due to UTI, borderline hypoxemia due to covid-19 pneumonia, and AKI.  Requested blood and urine cultures, additional fluids if remains hypotensive. Given hypoxemia, persistent infiltrate, started remdesivir and decadron as well. Add on CRP came back elevated. She was transferred to [facility] and liberated from oxygen. BP has shown some improvement. She's making plenty of urine.   Review of Systems: 2 days of fevers and chills, none recently, no chest pain, leg pain or swelling (usually has mild foot swelling that goes down with elevation) doesn't take lasix.and per HPI. All others reviewed and are negative.   Past Medical History:  Diagnosis Date  . Asthma   . Diabetes mellitus without complication ([place])   . Hypercholesterolemia   . Hypertension   . Morbid obesity ([place])   . PTSD (post-traumatic stress disorder)     . Seasonal allergies   . Thyroid disease    Past Surgical History:  Procedure Laterality Date  . ABDOMINAL HYSTERECTOMY    . CHOLECYSTECTOMY    . THYROIDECTOMY     - Smoked 16 years up to 2 ppd up until 10 days ago.  reports that she has been smoking cigarettes. She has been smoking about 0.50 packs per day. She has never used smokeless tobacco. She reports that she does not drink alcohol or use drugs. Allergies  Allergen Reactions  . Ciprofloxacin Hcl Itching  . Doxycycline Shortness Of Breath  . Promethazine Shortness Of Breath  . Promethazine Hcl Shortness Of Breath and Nausea And Vomiting  . Ibuprofen Other (See Comments)    Because she is on Mobic  Pt states "Because I take Mobic". Because she is on Mobic Pt states "Because I take Mobic".   . Limonene Nausea And Vomiting  . Other Nausea And Vomiting  . Cefdinir Itching  . Iodinated Diagnostic Agents Other (See Comments)    Patient states 2 hours after CT with IV contrast she had headache and nausea.   . Ciprofloxacin Rash   No family history on file. - Family history otherwise reviewed and not pertinent. No FH DVT reported.  Prior to Admission medications   Medication Sig Start Date End Date Taking? Authorizing Provider  levothyroxine (SYNTHROID, LEVOTHROID) 200 MCG tablet Take 200 mcg by mouth daily before breakfast.   Yes [provider]  lisinopril (PRINIVIL,ZESTRIL) 10 MG tablet Take 10 mg by mouth daily.   Yes [provider]  metFORMIN (GLUCOPHAGE)  500 MG tablet Take by mouth 2 (two) times daily with a meal.   Yes [provider]  rosuvastatin (CRESTOR) 40 MG tablet TAKE 1 2 (ONE HALF) TABLET BY MOUTH IN THE EVENING (INSTEAD OF LIPITOR) 06/27/19  Yes [provider]  albuterol (PROVENTIL HFA;VENTOLIN HFA) 108 (90 Base) MCG/ACT inhaler Inhale into the lungs. 07/03/16   [provider]  Fluoxetine HCl, PMDD, (SARAFEM) 10 MG TABS Take by mouth.    [provider]   HYDROcodone-acetaminophen (NORCO/VICODIN) 5-325 MG tablet Take 1 tablet by mouth every 6 (six) hours as needed. 01/13/19   Muthersbaugh, Dahlia Client, PA-C  sulfamethoxazole-trimethoprim (BACTRIM DS) 800-160 MG tablet Take 1 tablet by mouth 2 (two) times daily for 7 days. 12/11/19 12/18/19  Geoffery Lyons, MD    Physical Exam: Vitals:   12/18/19 1220 12/18/19 1400 12/18/19 1436 12/18/19 1530  BP: (!) 91/49 (!) 115/49 (!) 97/44 (!) 104/57  Pulse: 98 94 99 94  Resp: 18 (!) 23 (!) 21 13  Temp:    98.5 F (36.9 C)  TempSrc:    Oral  SpO2: 90% 91% 92% 94%  Weight:      Height:       Constitutional: 39 y.o. female in no distress, calm demeanor Eyes: Lids and conjunctivae normal, PERRL ENMT: Mucous membranes are moist. Posterior pharynx clear of any exudate or lesions. fair dentition.  Neck: normal, supple, no masses, no thyromegaly Respiratory: Non-labored breathing without accessory muscle use. Clear, distant breath sounds to auscultation bilaterally Cardiovascular: Regular rate and rhythm, no murmurs, rubs, or gallops. No carotid bruits. No JVD. No pitting LE edema. Palpable pedal pulses. Abdomen: Normoactive bowel sounds. No tenderness, non-distended, and no masses palpated. No hepatosplenomegaly. GU: No indwelling catheter Musculoskeletal: No clubbing / cyanosis. No joint deformity upper and lower extremities. Good ROM, no contractures. Normal muscle tone.  Skin: Warm, dry. No rashes, wounds, or ulcers. No significant lesions noted.  Neurologic: CN II-XII grossly intact. Speech normal. No focal deficits in motor strength or sensation in all extremities.Marland Kitchen  Psychiatric: Alert and oriented x3. Normal judgment and insight. Mood euthymic with congruent affect.   Labs on Admission: I have personally reviewed following labs and imaging studies  CBC: Recent Labs  Lab 12/18/19 1008  WBC 17.6*  NEUTROABS 13.9*  HGB 11.3*  HCT 34.4*  MCV 96.4  PLT 195   Basic Metabolic Panel: Recent Labs  Lab  12/18/19 1008  NA 134*  K 3.6  CL 101  CO2 21*  GLUCOSE 171*  BUN 19  CREATININE 1.64*  CALCIUM 9.2   Liver Function Tests: Recent Labs  Lab 12/18/19 1008  AST 93*  ALT 35  ALKPHOS 110  BILITOT 1.7*  PROT 7.5  ALBUMIN 3.6   CBG: Recent Labs  Lab 12/18/19 1632  GLUCAP 208*   Urine analysis:    Component Value Date/Time   COLORURINE ORANGE (A) 12/18/2019 1022   APPEARANCEUR CLOUDY (A) 12/18/2019 1022   LABSPEC 1.025 12/18/2019 1022   PHURINE 5.0 12/18/2019 1022   GLUCOSEU 250 (A) 12/18/2019 1022   HGBUR SMALL (A) 12/18/2019 1022   BILIRUBINUR MODERATE (A) 12/18/2019 1022   KETONESUR 15 (A) 12/18/2019 1022   PROTEINUR >300 (A) 12/18/2019 1022   NITRITE POSITIVE (A) 12/18/2019 1022   LEUKOCYTESUR MODERATE (A) 12/18/2019 1022   Pregnancy test: negative. Uterus surgically absent.  Radiological Exams on Admission: CT Head Wo Contrast  Result Date: 12/18/2019 CLINICAL DATA:  Dizziness EXAM: CT HEAD WITHOUT CONTRAST TECHNIQUE: Contiguous axial images were  obtained from the base of the skull through the vertex without intravenous contrast. COMPARISON:  None. FINDINGS: Brain: No evidence of acute infarction, hemorrhage, hydrocephalus, extra-axial collection or mass lesion/mass effect. Vascular: No hyperdense vessel or unexpected calcification. Skull: Normal. Negative for fracture or focal lesion. Sinuses/Orbits: No acute finding. Other: None. IMPRESSION: No acute intracranial findings. Electronically Signed   By: Duanne Guess D.O.   On: 12/18/2019 12:05   DG Chest Portable 1 View  Result Date: 12/18/2019 CLINICAL DATA:  COVID-19 positive.  Weakness and dizziness.  Cough. EXAM: PORTABLE CHEST 1 VIEW COMPARISON:  December 06, 2019 FINDINGS: There is ill-defined airspace opacity in each lung base. Lungs elsewhere clear. Heart is borderline enlarged with pulmonary vascularity normal. No adenopathy. No bone lesions. IMPRESSION: Ill-defined airspace opacity in the bases  consistent with bibasilar pneumonia. Lungs elsewhere clear. Heart borderline enlarged with pulmonary vascularity within normal limits. No adenopathy. Electronically Signed   By: Bretta Bang III M.D.   On: 12/18/2019 12:05   Assessment/Plan Principal Problem:   Sepsis due to urinary tract infection (HCC) Active Problems:   Hypertension   Hypothyroidism   Asthma   Diabetes mellitus without complication (HCC)   Morbid obesity (HCC)   Pneumonia due to COVID-19 virus   Acute respiratory failure with hypoxemia (HCC)   Sepsis due to UTI: POA. With leukocytosis, tachycardia, hypotension, lactic acidosis as well as AKI. No renal stones. Did not tolerate bactrim due to facial flushing, but took 3 doses with improved abdominal pain, still very infected-appearing urine. - Monitor blood and urine cultures - Continue ceftriaxone pending susceptibilities. Note hx intolerance to cefdinir, no reaction to first dose of CTX.  - Repeat 1L NS bolus. May need low dose vasopressor pending BP trend.  AKI: Due to sepsis and/or dehydration.  - Monitor metabolic panel with IVF - Hold ACEi, avoid hypotension - Hold metformin  Dizziness, weakness, orthostatic hypotension: Presumably due to dehydration/sepsis and orthostasis.  - PT, OT - IVF as above - Monitor orthostatic vital signs qShift  Acute hypoxemic respiratory failure due to covid-19 pneumonia: SARS-CoV-2 positive 1/9, symptoms began 1/4. Should be outside window of worsening, though infiltrates indicating pneumonia is persistent and was reportedly hypoxemic in ED. Currently that is resolved so we will reserve steroids.  - Start remdesivir x5 days 1/21 - 1/25. Would be candidate for outpatient infusion center if recovers from sepsis. - Vitamin C, zinc - Encourage OOB, IS, FV, and awake proning if able - Tylenol and antitussives prn - Continue airborne, contact precautions while admitted. Isolation period would be recommended for 21 days from  positive testing. - Check CBC w/diff, CMP, CRP daily - Enoxaparin weight-based prophylactic dose.  - Maintain euvolemia  - Avoid NSAIDs   T2DM:  - Carb modified diet - Sensitive SSI - Hold metformin  Asthma:  - Continue prn inhaler  Hyperlipidemia:  - Hold statin for right now with elevated LFT  LFT elevation:  - Mild, likely due to covid, not C/I to remdesivir. Continue monitoring.   Morbid obesity: BMI 54. Noted.  DVT prophylaxis: Lovenox 0.5mg /kg q24h  Code Status: Full  Family Communication: None at bedside Disposition Plan: Pending clinical trajectory Consults called: None  Admission status: Inpatient    Tyrone Nine, MD [facility] [email or web address] 12/18/2019, 6:06 PM

## 2019-12-18 NOTE — ED Provider Notes (Addendum)
[facility] EMERGENCY DEPARTMENT Provider Note   CSN: [id number] Arrival date & time: 12/18/19  0258     History Chief Complaint  Patient presents with  . Dizziness    Melissa Rollins is a 39 y.o. female.  HPI   Patient presents to the emergency room for evaluation of weakness and feeling funny.  Patient states the symptoms started last night.  She has been feeling dizzy like the room is moving.  She also feels like her legs feel funny.  She says it is not a numbness or weakness but they feel funny.  She felt like it was hard for her to stand and walk this morning.  Her husband assisted her and she was able to walk out of her house into her car and into the emergency room.  Patient was diagnosed with Covid earlier this month.  She felt like those symptoms are getting better.  She is denying any trouble with shortness of breath.  No recent fevers.  Patient was recently treated for UTI.  Medical records indicate the patient was seen in the emergency room on January 14.  Patient also was diagnosed with sciatica back in November of last year.  On January 14 the patient did have a CT scan.  It did show evidence of pneumonia.  There was no evidence of renal stones.  Past Medical History:  Diagnosis Date  . Asthma   . Diabetes mellitus without complication (HCC)   . Hypercholesterolemia   . Hypertension   . Morbid obesity (HCC)   . PTSD (post-traumatic stress disorder)   . Seasonal allergies   . Thyroid disease     There are no problems to display for this patient.   Past Surgical History:  Procedure Laterality Date  . ABDOMINAL HYSTERECTOMY    . CHOLECYSTECTOMY    . THYROIDECTOMY       OB History   No obstetric history on file.     No family history on file.  Social History   Tobacco Use  . Smoking status: Current Every Day Smoker    Packs/day: 0.50    Types: Cigarettes  . Smokeless tobacco: Never Used  Substance Use Topics  . Alcohol use: No  . Drug use: No     Home Medications Prior to Admission medications   Medication Sig Start Date End Date Taking? Authorizing Provider  levothyroxine (SYNTHROID, LEVOTHROID) 200 MCG tablet Take 200 mcg by mouth daily before breakfast.   Yes [provider]  lisinopril (PRINIVIL,ZESTRIL) 10 MG tablet Take 10 mg by mouth daily.   Yes [provider]  metFORMIN (GLUCOPHAGE) 500 MG tablet Take by mouth 2 (two) times daily with a meal.   Yes [provider]  rosuvastatin (CRESTOR) 40 MG tablet TAKE 1 2 (ONE HALF) TABLET BY MOUTH IN THE EVENING (INSTEAD OF LIPITOR) 06/27/19  Yes [provider]  albuterol (PROVENTIL HFA;VENTOLIN HFA) 108 (90 Base) MCG/ACT inhaler Inhale into the lungs. 07/03/16   [provider]  Fluoxetine HCl, PMDD, (SARAFEM) 10 MG TABS Take by mouth.    [provider]  HYDROcodone-acetaminophen (NORCO/VICODIN) 5-325 MG tablet Take 1 tablet by mouth every 6 (six) hours as needed. 01/13/19   Muthersbaugh, Dahlia Client, PA-C  sulfamethoxazole-trimethoprim (BACTRIM DS) 800-160 MG tablet Take 1 tablet by mouth 2 (two) times daily for 7 days. 12/11/19 12/18/19  Geoffery Lyons, MD    Allergies    Ciprofloxacin hcl, Doxycycline, Promethazine, Promethazine hcl, Ibuprofen, Limonene, Other, Cefdinir, Iodinated diagnostic agents, and  Ciprofloxacin  Review of Systems   Review of Systems  All other systems reviewed and are negative.   Physical Exam Updated Vital Signs BP (!) 91/49 (BP Location: Left Arm)   Pulse 98   Temp 98.4 F (36.9 C) (Oral)   Resp 18   Ht 1.702 m (5\' 7" )   Wt (!) 158.9 kg   SpO2 90%   BMI 54.88 kg/m   Physical Exam Vitals and nursing note reviewed.  Constitutional:      Appearance: She is well-developed.     Comments: Morbidly obese  HENT:     Head: Normocephalic and atraumatic.     Right Ear: External ear normal.     Left Ear: External ear normal.  Eyes:     General: No scleral icterus.       Right eye: No discharge.         Left eye: No discharge.     Conjunctiva/sclera: Conjunctivae normal.  Neck:     Trachea: No tracheal deviation.  Cardiovascular:     Rate and Rhythm: Normal rate and regular rhythm.  Pulmonary:     Effort: Pulmonary effort is normal. No respiratory distress.     Breath sounds: Normal breath sounds. No stridor. No wheezing or rales.     Comments: Oxygen saturation low 90s without supplemental oxygen Abdominal:     General: Bowel sounds are normal. There is no distension.     Palpations: Abdomen is soft.     Tenderness: There is no abdominal tenderness. There is no guarding or rebound.  Musculoskeletal:        General: Tenderness present.     Cervical back: Neck supple.     Comments: Tenderness palpation lumbar region  Skin:    General: Skin is warm and dry.     Findings: No rash.  Neurological:     General: No focal deficit present.     Mental Status: She is alert.     Cranial Nerves: No cranial nerve deficit (no facial droop, extraocular movements intact, no slurred speech).     Sensory: No sensory deficit.     Motor: No abnormal muscle tone or seizure activity.     Coordination: Coordination normal.     Comments: 5 out of 5 plantar flexion dorsiflexion, sensation intact bilateral lower extremities     ED Results / Procedures / Treatments   Labs (all labs ordered are listed, but only abnormal results are displayed) Labs Reviewed  CBC WITH DIFFERENTIAL/PLATELET - Abnormal; Notable for the following components:      Result Value   WBC 17.6 (*)    RBC 3.57 (*)    Hemoglobin 11.3 (*)    HCT 34.4 (*)    RDW 15.9 (*)    Neutro Abs 13.9 (*)    Basophils Absolute 0.2 (*)    Abs Immature Granulocytes 0.62 (*)    All other components within normal limits  BASIC METABOLIC PANEL - Abnormal; Notable for the following components:   Sodium 134 (*)    CO2 21 (*)    Glucose, Bld 171 (*)    Creatinine, Ser 1.64 (*)    GFR calc non Af Amer 39 (*)    GFR calc Af Amer 46 (*)    All  other components within normal limits  URINALYSIS, ROUTINE W REFLEX MICROSCOPIC - Abnormal; Notable for the following components:   Color, Urine ORANGE (*)    APPearance CLOUDY (*)    Glucose, UA 250 (*)  Hgb urine dipstick SMALL (*)    Bilirubin Urine MODERATE (*)    Ketones, ur 15 (*)    Protein, ur >300 (*)    Nitrite POSITIVE (*)    Leukocytes,Ua MODERATE (*)    All other components within normal limits  URINALYSIS, MICROSCOPIC (REFLEX) - Abnormal; Notable for the following components:   Bacteria, UA MANY (*)    All other components within normal limits  PREGNANCY, URINE  LACTIC ACID, PLASMA    EKG None  Radiology CT Head Wo Contrast  Result Date: 12/18/2019 CLINICAL DATA:  Dizziness EXAM: CT HEAD WITHOUT CONTRAST TECHNIQUE: Contiguous axial images were obtained from the base of the skull through the vertex without intravenous contrast. COMPARISON:  None. FINDINGS: Brain: No evidence of acute infarction, hemorrhage, hydrocephalus, extra-axial collection or mass lesion/mass effect. Vascular: No hyperdense vessel or unexpected calcification. Skull: Normal. Negative for fracture or focal lesion. Sinuses/Orbits: No acute finding. Other: None. IMPRESSION: No acute intracranial findings. Electronically Signed   By: Duanne Guess D.O.   On: 12/18/2019 12:05   DG Chest Portable 1 View  Result Date: 12/18/2019 CLINICAL DATA:  COVID-19 positive.  Weakness and dizziness.  Cough. EXAM: PORTABLE CHEST 1 VIEW COMPARISON:  December 06, 2019 FINDINGS: There is ill-defined airspace opacity in each lung base. Lungs elsewhere clear. Heart is borderline enlarged with pulmonary vascularity normal. No adenopathy. No bone lesions. IMPRESSION: Ill-defined airspace opacity in the bases consistent with bibasilar pneumonia. Lungs elsewhere clear. Heart borderline enlarged with pulmonary vascularity within normal limits. No adenopathy. Electronically Signed   By: Bretta Bang III M.D.   On: 12/18/2019  12:05    Procedures .Critical Care Performed by: Linwood Dibbles, MD Authorized by: Linwood Dibbles, MD   Critical care provider statement:    Critical care time (minutes):  45   Critical care was time spent personally by me on the following activities:  Discussions with consultants, evaluation of patient's response to treatment, examination of patient, ordering and performing treatments and interventions, ordering and review of laboratory studies, ordering and review of radiographic studies, pulse oximetry, re-evaluation of patient's condition, obtaining history from patient or surrogate and review of old charts   (including critical care time)  Medications Ordered in ED Medications  sodium chloride 0.9 % bolus 1,000 mL (has no administration in time range)  dexamethasone (DECADRON) injection 6 mg (has no administration in time range)  meclizine (ANTIVERT) tablet 25 mg (25 mg Oral Given 12/18/19 1035)  sodium chloride 0.9 % bolus 1,000 mL (1,000 mLs Intravenous New Bag/Given 12/18/19 1142)  cefTRIAXone (ROCEPHIN) 1 g in sodium chloride 0.9 % 100 mL IVPB (1 g Intravenous New Bag/Given 12/18/19 1144)  diphenhydrAMINE (BENADRYL) injection 12.5 mg (12.5 mg Intravenous Given 12/18/19 1145)    ED Course  I have reviewed the triage vital signs and the nursing notes.  Pertinent labs & imaging results that were available during my care of the patient were reviewed by me and considered in my medical decision making (see chart for details).  Clinical Course as of Dec 18 1535  Thu Dec 18, 2019  1107 Orthostatic vital signs reviewed.  Blood pressure drops.   [JK]  1149 Urinalysis shows signs of persistent infection.  White blood cell count noted for significant leukocytosis.   [JK]  1150 Electrolyte panel shows increased BUN and creatinine   [JK]  1237 Pt with elevated wbc, hypotension, borderline oxygen sat.  UA suggest uti, known covid.  Concerning for sepsis.  2 l fluid ordered, IBW  fluid bolus.  Will  continue hydration.  Abx given.  Admit for further treatment.   [JK]  1308 D.w Dr Laurita Quint   [JK]    Clinical Course User Index [JK] Dorie Rank, MD   MDM Rules/Calculators/A&P                      Patient presented to the emergency room for evaluation of dizziness and weakness.  Patient's initial vital signs were reassuring but patient was orthostatic.  She has had borderline blood pressures and has had borderline oxygen saturation.  Patient has known Covid virus infection diagnosed January 9.  Her chest x-ray shows persistent pneumonia.  Patient's laboratory tests are also notable for urinary tract infection and leukocytosis.  The symptoms are concerning for the possibility of early sepsis.  Lactic acid level is pending.  I have started the patient on Rocephin to cover for urinary tract infection.  I will consult with the medical service for transfer and admission to the hospital. Final Clinical Impression(s) / ED Diagnoses Final diagnoses:  Sepsis, due to unspecified organism, unspecified whether acute organ dysfunction present [facility])  Acute cystitis without hematuria  Pneumonia due to COVID-19 virus      Dorie Rank, MD 12/18/19 1250    Dorie Rank, MD 12/18/19 1537

## 2019-12-18 NOTE — ED Notes (Signed)
Per lab, D-Dimer hemolyzed again and needs to be redrawn. Order placed. Nurse redrawing blood.

## 2019-12-18 NOTE — ED Notes (Signed)
Attempted to call report.  Message left with [place], Diplomatic Services operational officer.  RN will call me back.

## 2019-12-19 LAB — COMPREHENSIVE METABOLIC PANEL
ALT: 35 U/L (ref 0–44)
AST: 73 U/L — ABNORMAL HIGH (ref 15–41)
Albumin: 3.6 g/dL (ref 3.5–5.0)
Alkaline Phosphatase: 113 U/L (ref 38–126)
Anion gap: 11 (ref 5–15)
BUN: 14 mg/dL (ref 6–20)
CO2: 24 mmol/L (ref 22–32)
Calcium: 9.3 mg/dL (ref 8.9–10.3)
Chloride: 104 mmol/L (ref 98–111)
Creatinine, Ser: 0.91 mg/dL (ref 0.44–1.00)
GFR calc Af Amer: >60 mL/min (ref >60)
GFR calc non Af Amer: >60 mL/min (ref >60)
Glucose, Bld: 167 mg/dL — ABNORMAL HIGH (ref 70–99)
Potassium: 3.6 mmol/L (ref 3.5–5.1)
Sodium: 139 mmol/L (ref 135–145)
Total Bilirubin: 1.3 mg/dL — ABNORMAL HIGH (ref 0.3–1.2)
Total Protein: 7.8 g/dL (ref 6.5–8.1)

## 2019-12-19 LAB — URINE CULTURE

## 2019-12-19 LAB — CBC WITH DIFFERENTIAL/PLATELET
Abs Immature Granulocytes: 0.37 10*3/uL — ABNORMAL HIGH (ref 0.00–0.07)
Basophils Absolute: 0.1 10*3/uL (ref 0.0–0.1)
Basophils Relative: 1 %
Eosinophils Absolute: 0 10*3/uL (ref 0.0–0.5)
Eosinophils Relative: 0 %
HCT: 33.3 % — ABNORMAL LOW (ref 36.0–46.0)
Hemoglobin: 10.6 g/dL — ABNORMAL LOW (ref 12.0–15.0)
Immature Granulocytes: 2 %
Lymphocytes Relative: 12 %
Lymphs Abs: 1.9 10*3/uL (ref 0.7–4.0)
MCH: 31 pg (ref 26.0–34.0)
MCHC: 31.8 g/dL (ref 30.0–36.0)
MCV: 97.4 fL (ref 80.0–100.0)
Monocytes Absolute: 0.8 10*3/uL (ref 0.1–1.0)
Monocytes Relative: 5 %
Neutro Abs: 13.2 10*3/uL — ABNORMAL HIGH (ref 1.7–7.7)
Neutrophils Relative %: 80 %
Platelets: 196 10*3/uL (ref 150–400)
RBC: 3.42 MIL/uL — ABNORMAL LOW (ref 3.87–5.11)
RDW: 16 % — ABNORMAL HIGH (ref 11.5–15.5)
WBC: 16.4 10*3/uL — ABNORMAL HIGH (ref 4.0–10.5)
nRBC: 0.1 % (ref 0.0–0.2)

## 2019-12-19 LAB — GLUCOSE, CAPILLARY
Glucose-Capillary: 146 mg/dL — ABNORMAL HIGH (ref 70–99)
Glucose-Capillary: 213 mg/dL — ABNORMAL HIGH (ref 70–99)
Glucose-Capillary: 210 mg/dL — ABNORMAL HIGH (ref 70–99)

## 2019-12-19 LAB — D-DIMER, QUANTITATIVE: D-Dimer, Quant: 1.9 ug/mL-FEU — ABNORMAL HIGH (ref 0.00–0.50)

## 2019-12-19 LAB — C-REACTIVE PROTEIN: CRP: 28.7 mg/dL — ABNORMAL HIGH (ref <1.0)

## 2019-12-19 LAB — HIV ANTIBODY (ROUTINE TESTING W REFLEX): HIV Screen 4th Generation wRfx: NONREACTIVE

## 2019-12-19 MED ORDER — SODIUM CHLORIDE 0.9 % IV BOLUS
1000.0000 mL | Freq: Once | INTRAVENOUS | Status: AC
  Administered 2019-12-19: 1000 mL via INTRAVENOUS

## 2019-12-19 MED ORDER — ENSURE MAX PROTEIN PO LIQD
11.0000 [oz_av] | Freq: Two times a day (BID) | ORAL | Status: DC
  Filled 2019-12-19 (×4): qty 330

## 2019-12-19 NOTE — Progress Notes (Signed)
 Initial Nutrition Assessment  DOCUMENTATION CODES:   Morbid obesity  INTERVENTION:   Ensure Max po BID, each supplement provides 150 kcal and 30 grams of protein.    NUTRITION DIAGNOSIS:   Increased nutrient needs related to catabolic illness as evidenced by estimated needs.  GOAL:   Patient will meet greater than or equal to 90% of their needs  MONITOR:   PO intake, Labs, Weight trends, Supplement acceptance  REASON FOR ASSESSMENT:   Malnutrition Screening Tool    ASSESSMENT:   39 yo female diagnosed with COVID-19 on 1/9 and admitted with sepsis due to UTI on 1/21, COVID-19 pneumonia. PMH includes morbid obesity, DM, HTN, hypercholesterolemia, PTSD  RD working remotely.  No recorded po intake. Unable to obtain diet and weight history at this time  Current wt 158.9 kg; no weight loss per weight encounters.   Labs: CBGs 146-236 Meds: Vit C, ss novolog with meals and at bedtime, zinc sulfate  Diet Order:   Diet Order            Diet Carb Modified Fluid consistency: Thin; Room service appropriate? Yes  Diet effective now              EDUCATION NEEDS:   Not appropriate for education at this time  Skin:  Skin Assessment: Reviewed RN Assessment  Last BM:  1/22  Height:   Ht Readings from Last 1 Encounters:  12/18/19 5\' 7"  (1.702 m)    Weight:   Wt Readings from Last 1 Encounters:  12/18/19 (!) 158.9 kg    Ideal Body Weight:  (IBW 61.4 kg; Adjusted BW 101 kg)  BMI:  Body mass index is 54.88 kg/m.  Estimated Nutritional Needs:   Kcal:  12/20/19 kcals  Protein:  125-151 g  Fluid:  >/= 2 L    Fortune Brannigan MS, RDN, LDN, CNSC [phone] Pager  [phone] Weekend/On-Call Pager

## 2019-12-19 NOTE — Progress Notes (Signed)
OT Cancellation Note  Patient Details Name: Melissa Rollins MRN: [id number] DOB: Dec 15, 1980   Cancelled Treatment:    Reason Eval/Treat Not Completed: OT screened, no needs identified, will sign off(Pt independent with no endurance issues per PT. )  Shrihaan Porzio,HILLARY 12/19/2019, 11:40 AM  Luisa Dago, OT/L   Acute OT Clinical Specialist Acute Rehabilitation Services Pager [phone] Office [phone]

## 2019-12-19 NOTE — Progress Notes (Signed)
   12/19/19 0800  Family/Significant Other Communication  Family/Significant Other Update Called;Updated  Called and updated husband. All questions asked and all questions answered.

## 2019-12-19 NOTE — Plan of Care (Signed)
Went over plan of care with pt and husband. All questions asked and all questions answered. 

## 2019-12-19 NOTE — Evaluation (Signed)
Physical Therapy Evaluation Patient Details Name: Melissa Rollins MRN: [id number] DOB: 1981-03-03 Today's Date: 12/19/2019   History of Present Illness  Pt adm with sepsis due to UTI. Pt also with AKI and dizziness likely due to orthostatic hypotension. Pt covid + on 1/9. PMH - morbid obesity, htn, DM, HTN.   Clinical Impression  Pt doing well with mobility and no further PT needed.  Ready for dc from PT standpoint.      Follow Up Recommendations No PT follow up    Equipment Recommendations  None recommended by PT    Recommendations for Other Services       Precautions / Restrictions Precautions Precautions: None      Mobility  Bed Mobility               General bed mobility comments: Pt up in chair  Transfers Overall transfer level: Independent Equipment used: None                Ambulation/Gait Ambulation/Gait assistance: Independent Gait Distance (Feet): 300 Feet Assistive device: None Gait Pattern/deviations: Wide base of support Gait velocity: normal Gait velocity interpretation: >4.37 ft/sec, indicative of normal walking speed General Gait Details: Steady gait. Amb on RA with SpO2 >95%  Stairs            Wheelchair Mobility    Modified Rankin (Stroke Patients Only)       Balance Overall balance assessment: No apparent balance deficits (not formally assessed)                                           Pertinent Vitals/Pain Pain Assessment: No/denies pain    Home Living Family/patient expects to be discharged to:: Private residence Living Arrangements: Spouse/significant other;Children Available Help at Discharge: Family Type of Home: House         Home Equipment: None      Prior Function Level of Independence: Independent               Hand Dominance        Extremity/Trunk Assessment   Upper Extremity Assessment Upper Extremity Assessment: Overall WFL for tasks assessed    Lower Extremity  Assessment Lower Extremity Assessment: Overall WFL for tasks assessed       Communication   Communication: No difficulties  Cognition Arousal/Alertness: Awake/alert Behavior During Therapy: WFL for tasks assessed/performed Overall Cognitive Status: Within Functional Limits for tasks assessed                                        General Comments General comments (skin integrity, edema, etc.): VSS on RA    Exercises     Assessment/Plan    PT Assessment Patent does not need any further PT services  PT Problem List         PT Treatment Interventions      PT Goals (Current goals can be found in the Care Plan section)  Acute Rehab PT Goals PT Goal Formulation: All assessment and education complete, DC therapy    Frequency     Barriers to discharge        Co-evaluation               AM-PAC PT "6 Clicks" Mobility  Outcome Measure Help needed turning from your back to  your side while in a flat bed without using bedrails?: None Help needed moving from lying on your back to sitting on the side of a flat bed without using bedrails?: None Help needed moving to and from a bed to a chair (including a wheelchair)?: None Help needed standing up from a chair using your arms (e.g., wheelchair or bedside chair)?: None Help needed to walk in hospital room?: None Help needed climbing 3-5 steps with a railing? : None 6 Click Score: 24    End of Session   Activity Tolerance: Patient tolerated treatment well Patient left: in chair Nurse Communication: Mobility status PT Visit Diagnosis: Other abnormalities of gait and mobility (R26.89)    Time: 7519-8242 PT Time Calculation (min) (ACUTE ONLY): 14 min   Charges:   PT Evaluation $PT Eval Low Complexity: 1 Low          [facility] PT Acute Rehabilitation Services Pager [phone] Office [phone]   Melissa Rollins [facility] 12/19/2019, 11:01 AM

## 2019-12-19 NOTE — Progress Notes (Signed)
PROGRESS NOTE  Melissa Rollins  WGN:[id number] DOB: 1981-06-08 DOA: 12/18/2019 PCP: Ignacia Palma., MD   Brief Narrative: Melissa Rollins is a 39 y.o. female with a history of obesity, HTN, HLD, T2DM, hypothyroidism, asthma who was recently diagnosed with covid-19 1/9 at [facility], UTI on 1/14 sent home on bactrim which she could not tolerate, returned to [facility] 1/21 with weakness, difficulty standing. She was tachycardic, hypotensive, with mild hypoxemia requiring 2L O2 and consistently infected-appearing urinalysis (nitrite-positive bacteriuria w/ >50WBC/HPF). CXR continued to show bibasilar airspace opacities. CT head due to dizziness was normal. Ceftriaxone, IV fluids, and remdesivir started, also gave a dose of decadron which was subsequently held due to rapid resolution of hypoxemia. AKI has improved with IV fluids, though she remains orthostatic, BP has also improved without pressors. Cultures are pending.   Assessment & Plan: Principal Problem:   Sepsis due to urinary tract infection (HCC) Active Problems:   Hypertension   Hypothyroidism   Asthma   Diabetes mellitus without complication (HCC)   Morbid obesity (HCC)   Pneumonia due to COVID-19 virus   Acute respiratory failure with hypoxemia (HCC)  Sepsis due to UTI: POA. With leukocytosis, tachycardia, hypotension, lactic acidosis as well as AKI. No renal stones. Did not tolerate bactrim due to facial flushing, but took 3 doses with improved abdominal pain, still very infected-appearing urine. - Monitor blood and urine cultures - Continue ceftriaxone pending susceptibilities. Note hx intolerance to cefdinir, tolerating CTX. - Lactic acidosis resolved with IV fluids, will repeat normal saline again today given persistent orthostasis.   AKI: Due to sepsis and/or dehydration. Improving. Urine still dark. Bilirubin 1.3 ?due to sepsis, Gilbert's, hx cholecystectomy.  - Monitor metabolic panel with additional IVF - Holding ACEi, avoid hypotension -  Holding metformin  Dizziness, weakness, orthostatic hypotension: Presumably due to dehydration/sepsis and orthostasis.  - PT, OT - IVF as above - Monitor orthostatic vital signs again later this afternoon after repeat bolus. D/w RN.  Acute hypoxemic respiratory failure due to covid-19 pneumonia: SARS-CoV-2 positive 1/9, symptoms began 1/4. Should be outside window of worsening, though infiltrates indicating pneumonia is persistent and was reportedly hypoxemic in ED. Currently that is resolved so we will reserve steroids.  - Start remdesivir x5 days 1/21 - 1/25. Would be candidate for outpatient infusion center if recovers from sepsis. - Vitamin C, zinc - Encourage OOB, IS, FV, and awake proning if able - Tylenol and antitussives prn - Continue airborne, contact precautions while admitted. Isolation period would be recommended for 21 days from positive testing. - Check CBC w/diff, CMP, CRP daily - Enoxaparin weight-based prophylactic dose.  - Maintain euvolemia  - Avoid NSAIDs   T2DM:  - Carb modified diet - Sensitive SSI - Hold metformin  Asthma:  - Continue prn inhaler  Hyperlipidemia:  - Holding statin for right now with elevated LFT  LFT elevation:  - Mild, likely due to covid, not C/I to remdesivir. AST declining modestly. Continue monitoring.   Morbid obesity: BMI 54. Noted.  DVT prophylaxis: Lovenox Code Status: Full Family Communication: None at bedside Disposition Plan: Pending clinical improvement. May be candidate for early DC and return for remdesivir in infusion clinic. Possible DC 1/23.  Consultants:   None  Procedures:   None  Antimicrobials:  Remdesivir  Ceftriaxone   Subjective: Feels well, no chills, fevers, cough, dyspnea, chest pain. Some lower abdominal pain, urinary urgency, hesitancy, no hematuria. Urine still dark but more volume. Remains orthostatic.   Objective: Vitals:   12/19/19 0440  12/19/19 0725 12/19/19 0726 12/19/19 0728   BP: (!) 104/59 (!) 110/57 (!) 105/59 106/67  Pulse: 81 84 94 87  Resp: 17 20 17 19   Temp: 98.1 F (36.7 C)   (!) 97.5 F (36.4 C)  TempSrc: Oral   Oral  SpO2: 100% 96% 99% (!) 89%  Weight:      Height:        Intake/Output Summary (Last 24 hours) at 12/19/2019 1003 Last data filed at 12/19/2019 0000 Gross per 24 hour  Intake 3602.97 ml  Output 1250 ml  Net 2352.97 ml   Filed Weights   12/18/19 1149  Weight: (!) 158.9 kg   Gen: Pleasant young female in no distress  Pulm: Non-labored breathing room air. Clear to auscultation bilaterally.  CV: Regular rate and rhythm. No murmur, rub, or gallop. No JVD, trace nonpitting LE edema. GI: Abdomen soft, non-tender, non-distended, with normoactive bowel sounds. No organomegaly or masses felt. Ext: Warm, no deformities Skin: No rashes, lesions or ulcers Neuro: Alert and oriented. No focal neurological deficits. Psych: Judgement and insight appear normal. Mood & affect appropriate.   Data Reviewed: I have personally reviewed following labs and imaging studies  CBC: Recent Labs  Lab 12/18/19 1008 12/19/19 0011  WBC 17.6* 16.4*  NEUTROABS 13.9* 13.2*  HGB 11.3* 10.6*  HCT 34.4* 33.3*  MCV 96.4 97.4  PLT 195 196   Basic Metabolic Panel: Recent Labs  Lab 12/18/19 1008 12/19/19 0011  NA 134* 139  K 3.6 3.6  CL 101 104  CO2 21* 24  GLUCOSE 171* 167*  BUN 19 14  CREATININE 1.64* 0.91  CALCIUM 9.2 9.3   GFR: Estimated Creatinine Clearance: 133 mL/min (by C-G formula based on SCr of 0.91 mg/dL). Liver Function Tests: Recent Labs  Lab 12/18/19 1008 12/19/19 0011  AST 93* 73*  ALT 35 35  ALKPHOS 110 113  BILITOT 1.7* 1.3*  PROT 7.5 7.8  ALBUMIN 3.6 3.6   No results for input(s): LIPASE, AMYLASE in the last 168 hours. No results for input(s): AMMONIA in the last 168 hours. Coagulation Profile: No results for input(s): INR, PROTIME in the last 168 hours. Cardiac Enzymes: No results for input(s): CKTOTAL, CKMB,  CKMBINDEX, TROPONINI in the last 168 hours. BNP (last 3 results) No results for input(s): PROBNP in the last 8760 hours. HbA1C: No results for input(s): HGBA1C in the last 72 hours. CBG: Recent Labs  Lab 12/18/19 1632 12/18/19 2047 12/19/19 0751  GLUCAP 208* 236* 146*   Lipid Profile: No results for input(s): CHOL, HDL, LDLCALC, TRIG, CHOLHDL, LDLDIRECT in the last 72 hours. Thyroid Function Tests: No results for input(s): TSH, T4TOTAL, FREET4, T3FREE, THYROIDAB in the last 72 hours. Anemia Panel: No results for input(s): VITAMINB12, FOLATE, FERRITIN, TIBC, IRON, RETICCTPCT in the last 72 hours. Urine analysis:    Component Value Date/Time   COLORURINE ORANGE (A) 12/18/2019 1022   APPEARANCEUR CLOUDY (A) 12/18/2019 1022   LABSPEC 1.025 12/18/2019 1022   PHURINE 5.0 12/18/2019 1022   GLUCOSEU 250 (A) 12/18/2019 1022   HGBUR SMALL (A) 12/18/2019 1022   BILIRUBINUR MODERATE (A) 12/18/2019 1022   KETONESUR 15 (A) 12/18/2019 1022   PROTEINUR >300 (A) 12/18/2019 1022   NITRITE POSITIVE (A) 12/18/2019 1022   LEUKOCYTESUR MODERATE (A) 12/18/2019 1022   Recent Results (from the past 240 hour(s))  Blood culture (routine x 2)     Status: None (Preliminary result)   Collection Time: 12/18/19  1:00 PM   Specimen: BLOOD  Result  Value Ref Range Status   Specimen Description   Final    BLOOD RIGHT ANTECUBITAL Performed at [facility], [address] [place], [place] [address]    Special Requests   Final    BOTTLES DRAWN AEROBIC AND ANAEROBIC Blood Culture adequate volume Performed at [facility], [address] [place], [place] [address]    Culture   Final    NO GROWTH < 24 HOURS Performed at [facility], [place] [address] [place], [place] [address]    Report Status PENDING  Incomplete  Blood culture (routine x 2)     Status: None (Preliminary result)   Collection Time: 12/18/19  1:13 PM   Specimen: BLOOD  Result Value Ref Range Status   Specimen  Description   Final    BLOOD LEFT ANTECUBITAL Performed at [facility], [address] [place], [place] [address]    Special Requests   Final    BOTTLES DRAWN AEROBIC AND ANAEROBIC Blood Culture adequate volume Performed at [facility], [address] [place], [place] [address]    Culture   Final    NO GROWTH < 24 HOURS Performed at [facility], [place] [address] [place], [place] [address]    Report Status PENDING  Incomplete      Radiology Studies: CT Head Wo Contrast  Result Date: 12/18/2019 CLINICAL DATA:  Dizziness EXAM: CT HEAD WITHOUT CONTRAST TECHNIQUE: Contiguous axial images were obtained from the base of the skull through the vertex without intravenous contrast. COMPARISON:  None. FINDINGS: Brain: No evidence of acute infarction, hemorrhage, hydrocephalus, extra-axial collection or mass lesion/mass effect. Vascular: No hyperdense vessel or unexpected calcification. Skull: Normal. Negative for fracture or focal lesion. Sinuses/Orbits: No acute finding. Other: None. IMPRESSION: No acute intracranial findings. Electronically Signed   By: Davina Poke D.O.   On: 12/18/2019 12:05   DG Chest Portable 1 View  Result Date: 12/18/2019 CLINICAL DATA:  COVID-19 positive.  Weakness and dizziness.  Cough. EXAM: PORTABLE CHEST 1 VIEW COMPARISON:  December 06, 2019 FINDINGS: There is ill-defined airspace opacity in each lung base. Lungs elsewhere clear. Heart is borderline enlarged with pulmonary vascularity normal. No adenopathy. No bone lesions. IMPRESSION: Ill-defined airspace opacity in the bases consistent with bibasilar pneumonia. Lungs elsewhere clear. Heart borderline enlarged with pulmonary vascularity within normal limits. No adenopathy. Electronically Signed   By: Lowella Grip III M.D.   On: 12/18/2019 12:05    Scheduled Meds: . vitamin C  500 mg Oral Daily  . enoxaparin (LOVENOX) injection  0.5 mg/kg Subcutaneous Q24H  . insulin aspart  0-5  Units Subcutaneous QHS  . insulin aspart  0-9 Units Subcutaneous TID WC  . levothyroxine  200 mcg Oral QAC breakfast  . sodium chloride flush  3 mL Intravenous Q12H  . zinc sulfate  220 mg Oral Daily   Continuous Infusions: . cefTRIAXone (ROCEPHIN)  IV    . remdesivir 100 mg in NS 100 mL       LOS: 1 day   Time spent: 35 minutes.  Patrecia Pour, MD [facility] [email or web address] 12/19/2019, 10:03 AM

## 2019-12-20 LAB — GLUCOSE, CAPILLARY
Glucose-Capillary: 158 mg/dL — ABNORMAL HIGH (ref 70–99)
Glucose-Capillary: 187 mg/dL — ABNORMAL HIGH (ref 70–99)

## 2019-12-20 LAB — COMPREHENSIVE METABOLIC PANEL
ALT: 38 U/L (ref 0–44)
AST: 89 U/L — ABNORMAL HIGH (ref 15–41)
Albumin: 3.3 g/dL — ABNORMAL LOW (ref 3.5–5.0)
Alkaline Phosphatase: 121 U/L (ref 38–126)
Anion gap: 12 (ref 5–15)
BUN: 11 mg/dL (ref 6–20)
CO2: 24 mmol/L (ref 22–32)
Calcium: 9.3 mg/dL (ref 8.9–10.3)
Chloride: 101 mmol/L (ref 98–111)
Creatinine, Ser: 0.7 mg/dL (ref 0.44–1.00)
GFR calc Af Amer: >60 mL/min (ref >60)
GFR calc non Af Amer: >60 mL/min (ref >60)
Glucose, Bld: 176 mg/dL — ABNORMAL HIGH (ref 70–99)
Potassium: 3.3 mmol/L — ABNORMAL LOW (ref 3.5–5.1)
Sodium: 137 mmol/L (ref 135–145)
Total Bilirubin: 1.1 mg/dL (ref 0.3–1.2)
Total Protein: 7.5 g/dL (ref 6.5–8.1)

## 2019-12-20 LAB — CBC WITH DIFFERENTIAL/PLATELET
Abs Immature Granulocytes: 0.16 10*3/uL — ABNORMAL HIGH (ref 0.00–0.07)
Basophils Absolute: 0.1 10*3/uL (ref 0.0–0.1)
Basophils Relative: 1 %
Eosinophils Absolute: 0.2 10*3/uL (ref 0.0–0.5)
Eosinophils Relative: 2 %
HCT: 33 % — ABNORMAL LOW (ref 36.0–46.0)
Hemoglobin: 10.7 g/dL — ABNORMAL LOW (ref 12.0–15.0)
Immature Granulocytes: 2 %
Lymphocytes Relative: 21 %
Lymphs Abs: 2.2 10*3/uL (ref 0.7–4.0)
MCH: 31.2 pg (ref 26.0–34.0)
MCHC: 32.4 g/dL (ref 30.0–36.0)
MCV: 96.2 fL (ref 80.0–100.0)
Monocytes Absolute: 0.7 10*3/uL (ref 0.1–1.0)
Monocytes Relative: 7 %
Neutro Abs: 7.4 10*3/uL (ref 1.7–7.7)
Neutrophils Relative %: 67 %
Platelets: 163 10*3/uL (ref 150–400)
RBC: 3.43 MIL/uL — ABNORMAL LOW (ref 3.87–5.11)
RDW: 16 % — ABNORMAL HIGH (ref 11.5–15.5)
WBC: 10.6 10*3/uL — ABNORMAL HIGH (ref 4.0–10.5)
nRBC: 0 % (ref 0.0–0.2)

## 2019-12-20 LAB — D-DIMER, QUANTITATIVE: D-Dimer, Quant: 1.63 ug/mL-FEU — ABNORMAL HIGH (ref 0.00–0.50)

## 2019-12-20 LAB — C-REACTIVE PROTEIN: CRP: 21.6 mg/dL — ABNORMAL HIGH (ref <1.0)

## 2019-12-20 MED ORDER — CEPHALEXIN 500 MG PO CAPS
500.0000 mg | ORAL_CAPSULE | Freq: Three times a day (TID) | ORAL | Status: DC
  Administered 2019-12-20: 500 mg via ORAL
  Filled 2019-12-20 (×3): qty 1

## 2019-12-20 MED ORDER — FLUCONAZOLE 150 MG PO TABS: 150.0000 mg | ORAL_TABLET | Freq: Once | ORAL | 0 refills | Status: AC

## 2019-12-20 MED ORDER — CEPHALEXIN 500 MG PO CAPS: 500.0000 mg | ORAL_CAPSULE | Freq: Four times a day (QID) | ORAL | 0 refills | Status: AC

## 2019-12-20 MED ORDER — POTASSIUM CHLORIDE CRYS ER 20 MEQ PO TBCR
40.0000 meq | EXTENDED_RELEASE_TABLET | Freq: Once | ORAL | Status: AC
  Administered 2019-12-20: 40 meq via ORAL
  Filled 2019-12-20: qty 2

## 2019-12-20 NOTE — Plan of Care (Signed)
  Problem: Education: Goal: Knowledge of risk factors and measures for prevention of condition will improve 12/20/2019 1204 by Janan Halter, RN Outcome: Adequate for Discharge 12/20/2019 1204 by Janan Halter, RN Outcome: Progressing

## 2019-12-20 NOTE — Progress Notes (Signed)
Patient scheduled for outpatient Remdesivir infusion at 0830AM on Sunday 1/24 and Monday 1/25.  Please advise them to report to [facility] at [address]  Drive to the security guard and tell them you are here for an infusion. They will direct you to the front entrance where we will come and get you.  For questions call [phone].  Thanks

## 2019-12-20 NOTE — Discharge Summary (Signed)
Physician Discharge Summary  Melissa Frasco AVW:[id number] DOB: 1981/08/21 DOA: 12/18/2019  PCP: Ignacia PalmaBeck, Mark C., MD  Admit date: 12/18/2019 Discharge date: 12/20/2019  Admitted From: Home Disposition: Home   Recommendations for Outpatient Follow-up:  1. Follow up with PCP in 1-2 weeks 2. Please obtain CMP/CBC in one week  Home Health: None Equipment/Devices: None Discharge Condition: Stable CODE STATUS: Full Diet recommendation: Heart healthy, carb-modified  Brief/Interim Summary: Melissa Rollins a 39 y.o.femalewith a history ofobesity, HTN, HLD, T2DM, hypothyroidism, asthmawho was recently [facility], UTI on 1/14 sent home on bactrim which she could not tolerate, [facility] with weakness, difficulty standing. She was tachycardic, hypotensive, with mild hypoxemia requiring 2L O2 and consistently infected-appearing urinalysis (nitrite-positive bacteriuria w/ >50WBC/HPF). CXR continued to show bibasilar airspace opacities. CT head due to dizziness was normal. Ceftriaxone, IV fluids, and remdesivir started, also gave a dose of decadron which was subsequently held due to rapid resolution of hypoxemia. AKI and orthostatic hypotension have improved with IV fluids. She has shown sustained clinical improvement and good tolerance of cephalosporin, and will be discharged with prescription for keflex and appointments for remdesivir infusions on 1/24, 1/25.  Discharge Diagnoses:  Principal Problem:   Sepsis due to urinary tract infection (HCC) Active Problems:   Hypertension   Hypothyroidism   Asthma   Diabetes mellitus without complication (HCC)   Morbid obesity (HCC)   Pneumonia due to COVID-19 virus   Acute respiratory failure with hypoxemia (HCC)  Sepsis due to UTI: POA. With leukocytosis, tachycardia, hypotension, lactic acidosis as well as AKI. No renal stones. Did not tolerate bactrim due to facial flushing, but took 3 doses with improved abdominal pain,  still very infected-appearing urine. - Urine culture nonclonal, but improving on cephalosporin. Will trial keflex for tolerance prior to discharge and prescribe. Blood cultures negative at time of discharge.  - Orthostasis/hypotension resolved, taking good po  AKI: Due to sepsis and/or dehydration. Resolved. - Holding ACEi, avoid hypotension  Dizziness, weakness, orthostatic hypotension: Presumably due to dehydration/sepsis and orthostasis. This has improved significantly. No follow up recommended by PT.  Acute hypoxemic respiratory failure due to covid-19 pneumonia: SARS-CoV-2positive 1/9, symptoms began 1/4. Should be outside window of worsening. Hypoxemia durably resolved, no further steroids recommended.  -Completeremdesivir x5 days 1/21- 1/25, arranged outpatient infusions 1/24, 1/25. - Isolation period would be recommended for 21 days from positive testing.  T2DM: Restart home medication.  Asthma:  - Continue prn inhaler  Hyperlipidemia:  - Ok to restart statin. ALT 38.  LFT elevation:  - Mild, likely due to covid, not C/I to remdesivir. ALT wnl, bilirubin normalized.  Morbid obesity: BMI 54. Noted.  Discharge Instructions Discharge Instructions    Diet - low sodium heart healthy   Complete by: As directed    Discharge instructions   Complete by: As directed    You are being discharged from the hospital after treatment for covid-19 infection. You are felt to be stable enough to no longer require inpatient monitoring, testing, and treatment, though you will need to follow the recommendations below: - Do not take lisinopril until you follow up with your doctor. - Continue taking keflex as directed for 7 more days to treat the urinary tract infection. This is being given prior to your discharge to verify your tolerance. If you develop any further symptoms after discharge, please contact your doctor right away. - You have been scheduled for outpatient Remdesivir  infusion at 0830AM on Sunday 1/24 and Monday 1/25.  Please report to [facility] at [address]  Drive to the security guard and tell them you are here for an infusion. They will direct you to the front entrance where we will come and get you. - Per CDC guidelines, you will need to remain in isolation for 21 days from your first positive covid test. - Do not take NSAID medications (including, but not limited to, ibuprofen, advil, motrin, naproxen, aleve, goody's powder, etc.) - Follow up with your doctor in the next week via telehealth or seek medical attention right away if your symptoms get WORSE.  - Consider donating plasma after you have recovered (either 14 days after a negative test or 28 days after symptoms have completely resolved) because your antibodies to this virus may be helpful to give to others with life-threatening infections. Please go to the website www.[email or web address] if you would like to consider volunteering for plasma donation.    Directions for you at home:  Wear a facemask You should wear a facemask that covers your nose and mouth when you are in the same room with other people and when you visit a healthcare provider. People who live with or visit you should also wear a facemask while they are in the same room with you.  Separate yourself from other people in your home As much as possible, you should stay in a different room from other people in your home. Also, you should use a separate bathroom, if available.  Avoid sharing household items You should not share dishes, drinking glasses, cups, eating utensils, towels, bedding, or other items with other people in your home. After using these items, you should wash them thoroughly with soap and water.  Cover your coughs and sneezes Cover your mouth and nose with a tissue when you cough or sneeze, or you can cough or sneeze into your sleeve. Throw used tissues in a lined trash can, and immediately wash your  hands with soap and water for at least 20 seconds or use an alcohol-based hand rub.  Wash your [facility] your hands often and thoroughly with soap and water for at least 20 seconds. You can use an alcohol-based hand sanitizer if soap and water are not available and if your hands are not visibly dirty. Avoid touching your eyes, nose, and mouth with unwashed hands.  Directions for those who live with, or provide care at home for you:  Limit the number of people who have contact with the patient If possible, have only one caregiver for the patient. Other household members should stay in another home or place of residence. If this is not possible, they should stay in another room, or be separated from the patient as much as possible. Use a separate bathroom, if available. Restrict visitors who do not have an essential need to be in the home.  Ensure good ventilation Make sure that shared spaces in the home have good air flow, such as from an air conditioner or an opened window, weather permitting.  Wash your hands often Wash your hands often and thoroughly with soap and water for at least 20 seconds. You can use an alcohol based hand sanitizer if soap and water are not available and if your hands are not visibly dirty. Avoid touching your eyes, nose, and mouth with unwashed hands. Use disposable paper towels to dry your hands. If not available, use dedicated cloth towels and replace them when they become wet.  Wear a facemask and gloves Wear  a disposable facemask at all times in the room and gloves when you touch or have contact with the patient's blood, body fluids, and/or secretions or excretions, such as sweat, saliva, sputum, nasal mucus, vomit, urine, or feces.  Ensure the mask fits over your nose and mouth tightly, and do not touch it during use. Throw out disposable facemasks and gloves after using them. Do not reuse. Wash your hands immediately after removing your facemask and  gloves. If your personal clothing becomes contaminated, carefully remove clothing and launder. Wash your hands after handling contaminated clothing. Place all used disposable facemasks, gloves, and other waste in a lined container before disposing them with other household waste. Remove gloves and wash your hands immediately after handling these items.  Do not share dishes, glasses, or other household items with the patient Avoid sharing household items. You should not share dishes, drinking glasses, cups, eating utensils, towels, bedding, or other items with a patient who is confirmed to have, or being evaluated for, COVID-19 infection. After the person uses these items, you should wash them thoroughly with soap and water.  Wash laundry thoroughly Immediately remove and wash clothes or bedding that have blood, body fluids, and/or secretions or excretions, such as sweat, saliva, sputum, nasal mucus, vomit, urine, or feces, on them. Wear gloves when handling laundry from the patient. Read and follow directions on labels of laundry or clothing items and detergent. In general, wash and dry with the warmest temperatures recommended on the label.  Clean all areas the individual has used often Clean all touchable surfaces, such as counters, tabletops, doorknobs, bathroom fixtures, toilets, phones, keyboards, tablets, and bedside tables, every day. Also, clean any surfaces that may have blood, body fluids, and/or secretions or excretions on them. Wear gloves when cleaning surfaces the patient has come in contact with. Use a diluted bleach solution (e.g., dilute bleach with 1 part bleach and 10 parts water) or a household disinfectant with a label that says EPA-registered for coronaviruses. To make a bleach solution at home, add 1 tablespoon of bleach to 1 quart (4 cups) of water. For a larger supply, add  cup of bleach to 1 gallon (16 cups) of water. Read labels of cleaning products and follow  recommendations provided on product labels. Labels contain instructions for safe and effective use of the cleaning product including precautions you should take when applying the product, such as wearing gloves or eye protection and making sure you have good ventilation during use of the product. Remove gloves and wash hands immediately after cleaning.  Monitor yourself for signs and symptoms of illness Caregivers and household members are considered close contacts, should monitor their health, and will be asked to limit movement outside of the home to the extent possible. Follow the monitoring steps for close contacts listed on the symptom monitoring form.  If you have additional questions, contact your local health department or call the epidemiologist on call at [phone] (available 24/7). This guidance is subject to change. For the most up-to-date guidance from [facility], please refer to their website: TripMetro.hu   Increase activity slowly   Complete by: As directed    MyChart COVID-19 home monitoring program   Complete by: Dec 20, 2019    Is the patient willing to use the MyChart Mobile App for home monitoring?: Yes     Allergies as of 12/20/2019      Reactions   Ciprofloxacin Hcl Itching   Doxycycline Shortness Of Breath   Promethazine Shortness Of Breath  Promethazine Hcl Shortness Of Breath, Nausea And Vomiting   Ibuprofen Other (See Comments)   Because she is on Mobic Pt states "Because I take Mobic". Because she is on Mobic Pt states "Because I take Mobic".   Limonene Nausea And Vomiting   Other Nausea And Vomiting   Cefdinir Itching   Iodinated Diagnostic Agents Other (See Comments)   Patient states 2 hours after CT with IV contrast she had headache and nausea.    Ciprofloxacin Rash      Medication List    STOP taking these medications   sulfamethoxazole-trimethoprim 800-160 MG tablet Commonly known as:  BACTRIM DS     TAKE these medications   albuterol 108 (90 Base) MCG/ACT inhaler Commonly known as: VENTOLIN HFA Inhale into the lungs.   cephALEXin 500 MG capsule Commonly known as: KEFLEX Take 1 capsule (500 mg total) by mouth 4 (four) times daily for 7 days.   levothyroxine 200 MCG tablet Commonly known as: SYNTHROID Take 200 mcg by mouth daily before breakfast.   lisinopril 10 MG tablet Commonly known as: ZESTRIL Take 10 mg by mouth daily.   metFORMIN 500 MG tablet Commonly known as: GLUCOPHAGE Take 1,000 mg by mouth 2 (two) times daily with a meal.   rosuvastatin 40 MG tablet Commonly known as: CRESTOR Take 20 mg by mouth daily.      Follow-up Information    Ignacia PalmaBeck, Mark C., MD. Schedule an appointment as soon as possible for a visit in 1 week(s).   Specialty: Family Medicine Contact information: [address]  [address] [place] [address] [phone]          Allergies  Allergen Reactions  . Ciprofloxacin Hcl Itching  . Doxycycline Shortness Of Breath  . Promethazine Shortness Of Breath  . Promethazine Hcl Shortness Of Breath and Nausea And Vomiting  . Ibuprofen Other (See Comments)    Because she is on Mobic  Pt states "Because I take Mobic". Because she is on Mobic Pt states "Because I take Mobic".   . Limonene Nausea And Vomiting  . Other Nausea And Vomiting  . Cefdinir Itching  . Iodinated Diagnostic Agents Other (See Comments)    Patient states 2 hours after CT with IV contrast she had headache and nausea.   . Ciprofloxacin Rash    Consultations:  None  Procedures/Studies: CT Head Wo Contrast  Result Date: 12/18/2019 CLINICAL DATA:  Dizziness EXAM: CT HEAD WITHOUT CONTRAST TECHNIQUE: Contiguous axial images were obtained from the base of the skull through the vertex without intravenous contrast. COMPARISON:  None. FINDINGS: Brain: No evidence of acute infarction, hemorrhage, hydrocephalus, extra-axial collection or mass  lesion/mass effect. Vascular: No hyperdense vessel or unexpected calcification. Skull: Normal. Negative for fracture or focal lesion. Sinuses/Orbits: No acute finding. Other: None. IMPRESSION: No acute intracranial findings. Electronically Signed   By: Duanne GuessNicholas  Plundo D.O.   On: 12/18/2019 12:05   DG Chest Portable 1 View  Result Date: 12/18/2019 CLINICAL DATA:  COVID-19 positive.  Weakness and dizziness.  Cough. EXAM: PORTABLE CHEST 1 VIEW COMPARISON:  December 06, 2019 FINDINGS: There is ill-defined airspace opacity in each lung base. Lungs elsewhere clear. Heart is borderline enlarged with pulmonary vascularity normal. No adenopathy. No bone lesions. IMPRESSION: Ill-defined airspace opacity in the bases consistent with bibasilar pneumonia. Lungs elsewhere clear. Heart borderline enlarged with pulmonary vascularity within normal limits. No adenopathy. Electronically Signed   By: Bretta BangWilliam  Woodruff III M.D.   On: 12/18/2019 12:05   DG Chest Portable  1 View  Result Date: 12/06/2019 CLINICAL DATA:  Cough and shortness of breath EXAM: PORTABLE CHEST 1 VIEW COMPARISON:  December 24, 2018 FINDINGS: There is airspace consolidation in the left lower lung region consistent with pneumonia. Lungs elsewhere clear. Heart is upper normal in size with pulmonary vascularity normal. No adenopathy. No bone lesions. IMPRESSION: Consolidation consistent with pneumonia left lower lung region. Lungs elsewhere clear. Heart upper normal in size. No evident adenopathy. Electronically Signed   By: Bretta Bang III M.D.   On: 12/06/2019 13:17   CT Renal Stone Study  Result Date: 12/11/2019 CLINICAL DATA:  Flank pain. EXAM: CT ABDOMEN AND PELVIS WITHOUT CONTRAST TECHNIQUE: Multidetector CT imaging of the abdomen and pelvis was performed following the standard protocol without IV contrast. COMPARISON:  07/12/2018 FINDINGS: Lower chest: Patchy areas of ill-defined ground-glass and confluent airspace disease noted in the lower  lungs bilaterally. No pleural effusion. Hepatobiliary: The liver shows diffusely decreased attenuation suggesting fat deposition. Liver measures 27 cm craniocaudal length, enlarged. No focal abnormality in the liver on this study without intravenous contrast. Gallbladder is surgically absent. No intrahepatic or extrahepatic biliary dilation. Pancreas: No focal mass lesion. No dilatation of the main duct. No intraparenchymal cyst. No peripancreatic edema. Spleen: Enlarged at 15.9 cm craniocaudal length. No focal mass lesion. Adrenals/Urinary Tract: 2.7 cm left adrenal adenoma noted. Right adrenal gland unremarkable. Right kidney and ureter unremarkable. Left kidney and ureter unremarkable. Urinary bladder is minimally distended without obvious wall thickening. Stomach/Bowel: Stomach is unremarkable. No gastric wall thickening. No evidence of outlet obstruction. Duodenum is normally positioned as is the ligament of Treitz. No small bowel wall thickening. No small bowel dilatation. The terminal ileum is normal. The appendix is normal. No gross colonic mass. No colonic wall thickening. Vascular/Lymphatic: There is abdominal aortic atherosclerosis without aneurysm. There is no gastrohepatic or hepatoduodenal ligament lymphadenopathy. No retroperitoneal or mesenteric lymphadenopathy. No pelvic sidewall lymphadenopathy. Reproductive: The uterus is surgically absent. There is no adnexal mass. Other: No intraperitoneal free fluid. Musculoskeletal: 5 x 5 x 5 cm umbilical hernia contains only fat. There is soft tissue stranding in the subcutaneous fat of the lower anterior abdominal wall. No worrisome lytic or sclerotic osseous abnormality. IMPRESSION: 1. No urinary stone disease. No secondary changes in either kidney or ureter. 2. Patchy areas of ill-defined ground-glass and confluent airspace opacity in the lower lungs bilaterally. Imaging features compatible with multifocal pneumonia. 3. Similar appearance of soft tissue  attenuation in the subcutaneous fat of the lower anterior abdominal wall. While cellulitis could have this appearance, the relative stability over more than 1 year suggests this may be related to chronic etiology. 4. Hepatic steatosis with hepatosplenomegaly. 5. Stable left adrenal adenoma. 6. Stable appearance fat containing umbilical hernia. Electronically Signed   By: Kennith Center M.D.   On: 12/11/2019 09:28   Subjective: Feels well, no dyspnea, chest pain, abd pain, N/V/D, changes in urination, fever, chills, flank pain.  Discharge Exam: Vitals:   12/20/19 0530 12/20/19 0728  BP: (!) 118/57 115/64  Pulse: 84 82  Resp: 18 18  Temp: 97.9 F (36.6 C) 98.4 F (36.9 C)  SpO2: 99% 100%   General: Pt is alert, awake, not in acute distress Cardiovascular: RRR, S1/S2 +, no rubs, no gallops Respiratory: CTA bilaterally, no wheezing, no rhonchi Abdominal: Soft, NT, ND, bowel sounds + Extremities: No edema, no cyanosis  Labs: BNP (last 3 results) Recent Labs    12/06/19 1439  BNP 26.9   Basic Metabolic Panel: Recent  Labs  Lab 12/18/19 1008 12/19/19 0011 12/20/19 0007  NA 134* 139 137  K 3.6 3.6 3.3*  CL 101 104 101  CO2 21* 24 24  GLUCOSE 171* 167* 176*  BUN 19 14 11   CREATININE 1.64* 0.91 0.70  CALCIUM 9.2 9.3 9.3   Liver Function Tests: Recent Labs  Lab 12/18/19 1008 12/19/19 0011 12/20/19 0007  AST 93* 73* 89*  ALT 35 35 38  ALKPHOS 110 113 121  BILITOT 1.7* 1.3* 1.1  PROT 7.5 7.8 7.5  ALBUMIN 3.6 3.6 3.3*   CBC: Recent Labs  Lab 12/18/19 1008 12/19/19 0011 12/20/19 0007  WBC 17.6* 16.4* 10.6*  NEUTROABS 13.9* 13.2* 7.4  HGB 11.3* 10.6* 10.7*  HCT 34.4* 33.3* 33.0*  MCV 96.4 97.4 96.2  PLT 195 196 163   CBG: Recent Labs  Lab 12/18/19 2047 12/19/19 0751 12/19/19 1141 12/19/19 1624 12/20/19 0839  GLUCAP 236* 146* 213* 210* 158*   D-Dimer Recent Labs    12/19/19 0011 12/20/19 0007  DDIMER 1.90* 1.63*   Urinalysis    Component Value  Date/Time   COLORURINE ORANGE (A) 12/18/2019 1022   APPEARANCEUR CLOUDY (A) 12/18/2019 1022   LABSPEC 1.025 12/18/2019 1022   PHURINE 5.0 12/18/2019 1022   GLUCOSEU 250 (A) 12/18/2019 1022   HGBUR SMALL (A) 12/18/2019 1022   BILIRUBINUR MODERATE (A) 12/18/2019 1022   KETONESUR 15 (A) 12/18/2019 1022   PROTEINUR >300 (A) 12/18/2019 1022   NITRITE POSITIVE (A) 12/18/2019 1022   LEUKOCYTESUR MODERATE (A) 12/18/2019 1022    Microbiology Recent Results (from the past 240 hour(s))  Urine Culture     Status: Abnormal   Collection Time: 12/18/19 10:22 AM   Specimen: Urine, Random  Result Value Ref Range Status   Specimen Description   Final    URINE, RANDOM Performed at [facility], [address] [place], [place]    Special Requests   Final    NONE Performed at [facility], [address] [place], [place]    Culture MULTIPLE SPECIES PRESENT, SUGGEST RECOLLECTION (A)  Final   Report Status 12/19/2019 FINAL  Final  Blood culture (routine x 2)     Status: None (Preliminary result)   Collection Time: 12/18/19  1:00 PM   Specimen: BLOOD  Result Value Ref Range Status   Specimen Description   Final    BLOOD RIGHT ANTECUBITAL Performed at [facility], [address] [place], [place]    Special Requests   Final    BOTTLES DRAWN AEROBIC AND ANAEROBIC Blood Culture adequate volume Performed at [facility], [address] [place], [place]    Culture   Final    NO GROWTH 2 DAYS Performed at [facility], [address], [place], [place]    Report Status PENDING  Incomplete  Blood culture (routine x 2)     Status: None (Preliminary result)   Collection Time: 12/18/19  1:13 PM   Specimen: BLOOD  Result Value Ref Range Status   Specimen Description   Final    BLOOD LEFT ANTECUBITAL Performed at [facility], [address] [place], [place]    Special  Requests   Final    BOTTLES DRAWN AEROBIC AND ANAEROBIC Blood Culture adequate volume Performed at [facility], [address] [place], [place]    Culture  Final    NO GROWTH 2 DAYS Performed at [facility], [address], [place], [place] [address]    Report Status PENDING  Incomplete    Time coordinating discharge: Approximately 40 minutes  Tyrone Nine, MD  [facility] 12/20/2019, 10:31 AM

## 2019-12-20 NOTE — Progress Notes (Signed)
PIV removed, pt given d/c paperwork, no further questions. VSS, RA 96%. No pain or signs of respiratory distress. Transported to car via wheelchair.

## 2019-12-20 NOTE — Discharge Instructions (Addendum)
 COVID-19 COVID-19 is a respiratory infection that is caused by a virus called severe acute respiratory syndrome coronavirus 2 (SARS-CoV-2). The disease is also known as coronavirus disease or novel coronavirus. In some people, the virus may not cause any symptoms. In others, it may cause a serious infection. The infection can get worse quickly and can lead to complications, such as:  Pneumonia, or infection of the lungs.  Acute respiratory distress syndrome or ARDS. This is a condition in which fluid build-up in the lungs prevents the lungs from filling with air and passing oxygen into the blood.  Acute respiratory failure. This is a condition in which there is not enough oxygen passing from the lungs to the body or when carbon dioxide is not passing from the lungs out of the body.  Sepsis or septic shock. This is a serious bodily reaction to an infection.  Blood clotting problems.  Secondary infections due to bacteria or fungus.  Organ failure. This is when your body's organs stop working. The virus that causes COVID-19 is contagious. This means that it can spread from person to person through droplets from coughs and sneezes (respiratory secretions). What are the causes? This illness is caused by a virus. You may catch the virus by:  Breathing in droplets from an infected person. Droplets can be spread by a person breathing, speaking, singing, coughing, or sneezing.  Touching something, like a table or a doorknob, that was exposed to the virus (contaminated) and then touching your mouth, nose, or eyes. What increases the risk? Risk for infection You are more likely to be infected with this virus if you:  Are within 6 feet (2 meters) of a person with COVID-19.  Provide care for or live with a person who is infected with COVID-19.  Spend time in crowded indoor spaces or live in shared housing. Risk for serious illness You are more likely to become seriously ill from the virus if  you:  Are 50 years of age or older. The higher your age, the more you are at risk for serious illness.  Live in a nursing home or long-term care facility.  Have cancer.  Have a long-term (chronic) disease such as: ? Chronic lung disease, including chronic obstructive pulmonary disease or asthma. ? A long-term disease that lowers your body's ability to fight infection (immunocompromised). ? Heart disease, including heart failure, a condition in which the arteries that lead to the heart become narrow or blocked (coronary artery disease), a disease which makes the heart muscle thick, weak, or stiff (cardiomyopathy). ? Diabetes. ? Chronic kidney disease. ? Sickle cell disease, a condition in which red blood cells have an abnormal "sickle" shape. ? Liver disease.  Are obese. What are the signs or symptoms? Symptoms of this condition can range from mild to severe. Symptoms may appear any time from 2 to 14 days after being exposed to the virus. They include:  A fever or chills.  A cough.  Difficulty breathing.  Headaches, body aches, or muscle aches.  Runny or stuffy (congested) nose.  A sore throat.  New loss of taste or smell. Some people may also have stomach problems, such as nausea, vomiting, or diarrhea. Other people may not have any symptoms of COVID-19. How is this diagnosed? This condition may be diagnosed based on:  Your signs and symptoms, especially if: ? You live in an area with a COVID-19 outbreak. ? You recently traveled to or from an area where the virus is common. ?   You provide care for or live with a person who was diagnosed with COVID-19. ? You were exposed to a person who was diagnosed with COVID-19.  A physical exam.  Lab tests, which may include: ? Taking a sample of fluid from the back of your nose and throat (nasopharyngeal fluid), your nose, or your throat using a swab. ? A sample of mucus from your lungs (sputum). ? Blood tests.  Imaging tests,  which may include, X-rays, CT scan, or ultrasound. How is this treated? At present, there is no medicine to treat COVID-19. Medicines that treat other diseases are being used on a trial basis to see if they are effective against COVID-19. Your health care provider will talk with you about ways to treat your symptoms. For most people, the infection is mild and can be managed at home with rest, fluids, and over-the-counter medicines. Treatment for a serious infection usually takes places in a hospital intensive care unit (ICU). It may include one or more of the following treatments. These treatments are given until your symptoms improve.  Receiving fluids and medicines through an IV.  Supplemental oxygen. Extra oxygen is given through a tube in the nose, a face mask, or a hood.  Positioning you to lie on your stomach (prone position). This makes it easier for oxygen to get into the lungs.  Continuous positive airway pressure (CPAP) or bi-level positive airway pressure (BPAP) machine. This treatment uses mild air pressure to keep the airways open. A tube that is connected to a motor delivers oxygen to the body.  Ventilator. This treatment moves air into and out of the lungs by using a tube that is placed in your windpipe.  Tracheostomy. This is a procedure to create a hole in the neck so that a breathing tube can be inserted.  Extracorporeal membrane oxygenation (ECMO). This procedure gives the lungs a chance to recover by taking over the functions of the heart and lungs. It supplies oxygen to the body and removes carbon dioxide. Follow these instructions at home: Lifestyle  If you are sick, stay home except to get medical care. Your health care provider will tell you how long to stay home. Call your health care provider before you go for medical care.  Rest at home as told by your health care provider.  Do not use any products that contain nicotine or tobacco, such as cigarettes,  e-cigarettes, and chewing tobacco. If you need help quitting, ask your health care provider.  Return to your normal activities as told by your health care provider. Ask your health care provider what activities are safe for you. General instructions  Take over-the-counter and prescription medicines only as told by your health care provider.  Drink enough fluid to keep your urine pale yellow.  Keep all follow-up visits as told by your health care provider. This is important. How is this prevented?  There is no vaccine to help prevent COVID-19 infection. However, there are steps you can take to protect yourself and others from this virus. To protect yourself:   Do not travel to areas where COVID-19 is a risk. The areas where COVID-19 is reported change often. To identify high-risk areas and travel restrictions, check the CDC travel website: wwwnc.cdc.gov/travel/notices  If you live in, or must travel to, an area where COVID-19 is a risk, take precautions to avoid infection. ? Stay away from people who are sick. ? Wash your hands often with soap and water for 20 seconds. If soap and   water are not available, use an alcohol-based hand sanitizer. ? Avoid touching your mouth, face, eyes, or nose. ? Avoid going out in public, follow guidance from your state and local health authorities. ? If you must go out in public, wear a cloth face covering or face mask. Make sure your mask covers your nose and mouth. ? Avoid crowded indoor spaces. Stay at least 6 feet (2 meters) away from others. ? Disinfect objects and surfaces that are frequently touched every day. This may include:  Counters and tables.  Doorknobs and light switches.  Sinks and faucets.  Electronics, such as phones, remote controls, keyboards, computers, and tablets. To protect others: If you have symptoms of COVID-19, take steps to prevent the virus from spreading to others.  If you think you have a COVID-19 infection, contact  your health care provider right away. Tell your health care team that you think you may have a COVID-19 infection.  Stay home. Leave your house only to seek medical care. Do not use public transport.  Do not travel while you are sick.  Wash your hands often with soap and water for 20 seconds. If soap and water are not available, use alcohol-based hand sanitizer.  Stay away from other members of your household. Let healthy household members care for children and pets, if possible. If you have to care for children or pets, wash your hands often and wear a mask. If possible, stay in your own room, separate from others. Use a different bathroom.  Make sure that all people in your household wash their hands well and often.  Cough or sneeze into a tissue or your sleeve or elbow. Do not cough or sneeze into your hand or into the air.  Wear a cloth face covering or face mask. Make sure your mask covers your nose and mouth. Where to find more information  Centers for Disease Control and Prevention: www.cdc.gov/coronavirus/2019-ncov/index.html  World Health Organization: www.who.int/health-topics/coronavirus Contact a health care provider if:  You live in or have traveled to an area where COVID-19 is a risk and you have symptoms of the infection.  You have had contact with someone who has COVID-19 and you have symptoms of the infection. Get help right away if:  You have trouble breathing.  You have pain or pressure in your chest.  You have confusion.  You have bluish lips and fingernails.  You have difficulty waking from sleep.  You have symptoms that get worse. These symptoms may represent a serious problem that is an emergency. Do not wait to see if the symptoms will go away. Get medical help right away. Call your local emergency services (911 in the [place] Do not drive yourself to the hospital. Let the emergency medical personnel know if you think you have  COVID-19. Summary  COVID-19 is a respiratory infection that is caused by a virus. It is also known as coronavirus disease or novel coronavirus. It can cause serious infections, such as pneumonia, acute respiratory distress syndrome, acute respiratory failure, or sepsis.  The virus that causes COVID-19 is contagious. This means that it can spread from person to person through droplets from breathing, speaking, singing, coughing, or sneezing.  You are more likely to develop a serious illness if you are 50 years of age or older, have a weak immune system, live in a nursing home, or have chronic disease.  There is no medicine to treat COVID-19. Your health care provider will talk with you about ways to treat your   symptoms.  Take steps to protect yourself and others from infection. Wash your hands often and disinfect objects and surfaces that are frequently touched every day. Stay away from people who are sick and wear a mask if you are sick. This information is not intended to replace advice given to you by your health care provider. Make sure you discuss any questions you have with your health care provider. Document Revised: 09/12/2019 Document Reviewed: 12/19/2018 Elsevier Patient Education  2020 [employer]. You are scheduled for an outpatient infusion of Remdesivir at 0830AM on Sunday 1/24 and Monday 1/25.Marland Kitchen  Please report to [facility] at [address]  Drive to the security guard and tell them you are here for an infusion. They will direct you to the front entrance where we will come and get you.  For questions call [phone].  Thanks

## 2019-12-21 ENCOUNTER — Ambulatory Visit (HOSPITAL_COMMUNITY)
Admission: RE | Admit: 2019-12-21 | Discharge: 2019-12-21 | Disposition: A | Discharge status: Home / Self Care | Payer: Medicaid Other | Source: Ambulatory Visit | Attending: Pulmonary Disease | Admitting: Pulmonary Disease

## 2019-12-21 VITALS — BP 132/77 | HR 80 | Temp 98.1°F | Resp 18

## 2019-12-21 DIAGNOSIS — U071 COVID-19: Secondary | ICD-10-CM | POA: Diagnosis present

## 2019-12-21 DIAGNOSIS — J1282 Pneumonia due to coronavirus disease 2019: Secondary | ICD-10-CM | POA: Insufficient documentation

## 2019-12-21 LAB — GLUCOSE, CAPILLARY: Glucose-Capillary: 246 mg/dL — ABNORMAL HIGH (ref 70–99)

## 2019-12-21 MED ORDER — ALBUTEROL SULFATE HFA 108 (90 BASE) MCG/ACT IN AERS: 2.0000 | INHALATION_SPRAY | Freq: Once | RESPIRATORY_TRACT | Status: DC | PRN

## 2019-12-21 MED ORDER — SODIUM CHLORIDE 0.9 % IV SOLN
100.0000 mg | Freq: Once | INTRAVENOUS | Status: AC
  Administered 2019-12-21: 100 mg via INTRAVENOUS

## 2019-12-21 MED ORDER — EPINEPHRINE 0.3 MG/0.3ML IJ SOAJ: 0.3000 mg | Freq: Once | INTRAMUSCULAR | Status: DC | PRN

## 2019-12-21 MED ORDER — SODIUM CHLORIDE 0.9 % IV SOLN
INTRAVENOUS | Status: AC
  Filled 2019-12-21: qty 20

## 2019-12-21 MED ORDER — SODIUM CHLORIDE 0.9 % IV SOLN: INTRAVENOUS | Status: DC | PRN

## 2019-12-21 MED ORDER — DIPHENHYDRAMINE HCL 50 MG/ML IJ SOLN: 50.0000 mg | Freq: Once | INTRAMUSCULAR | Status: DC | PRN

## 2019-12-21 MED ORDER — METHYLPREDNISOLONE SODIUM SUCC 125 MG IJ SOLR: 125.0000 mg | Freq: Once | INTRAMUSCULAR | Status: DC | PRN

## 2019-12-21 MED ORDER — FAMOTIDINE IN NACL 20-0.9 MG/50ML-% IV SOLN: 20.0000 mg | Freq: Once | INTRAVENOUS | Status: DC | PRN

## 2019-12-21 NOTE — Discharge Instructions (Signed)

## 2019-12-21 NOTE — Progress Notes (Signed)
  Diagnosis: COVID-19  Physician: Dr. Wright  Procedure: [facility] Med: remdesivir infusion.  Complications: No immediate complications noted.  Discharge: Discharged home   Pilar Westergaard S Stephanie Mcglone 12/21/2019  

## 2019-12-22 ENCOUNTER — Ambulatory Visit (HOSPITAL_COMMUNITY)
Admit: 2019-12-22 | Discharge: 2019-12-22 | Disposition: A | Discharge status: Home / Self Care | Payer: Medicaid Other | Attending: Pulmonary Disease | Admitting: Pulmonary Disease

## 2019-12-22 DIAGNOSIS — U071 COVID-19: Secondary | ICD-10-CM | POA: Diagnosis not present

## 2019-12-22 MED ORDER — SODIUM CHLORIDE 0.9 % IV SOLN
INTRAVENOUS | Status: AC
  Administered 2019-12-22: 200 mg
  Filled 2019-12-22: qty 20

## 2019-12-22 NOTE — Progress Notes (Signed)
Patient ID: Melissa Rollins, female   DOB: December 23, 1980, 39 y.o.   MRN: [id number]  Diagnosis: COVID-19  Physician:dr wright   Procedure: [facility] Med: remdesivir infusion.  Complications: No immediate complications noted.  Discharge: Discharged home   Melissa Rollins 12/22/2019

## 2019-12-23 LAB — CULTURE, BLOOD (ROUTINE X 2)
Culture: NO GROWTH
Culture: NO GROWTH
Special Requests: ADEQUATE
Special Requests: ADEQUATE

## 2020-01-06 ENCOUNTER — Other Ambulatory Visit: Payer: Self-pay

## 2020-06-19 ENCOUNTER — Encounter (HOSPITAL_BASED_OUTPATIENT_CLINIC_OR_DEPARTMENT_OTHER): Payer: Self-pay | Admitting: Emergency Medicine

## 2020-06-19 ENCOUNTER — Emergency Department (HOSPITAL_BASED_OUTPATIENT_CLINIC_OR_DEPARTMENT_OTHER)
Admission: EM | Admit: 2020-06-19 | Discharge: 2020-06-20 | Disposition: A | Discharge status: Home / Self Care | Payer: Self-pay | Attending: Emergency Medicine | Admitting: Emergency Medicine

## 2020-06-19 ENCOUNTER — Other Ambulatory Visit: Payer: Self-pay

## 2020-06-19 ENCOUNTER — Emergency Department (HOSPITAL_BASED_OUTPATIENT_CLINIC_OR_DEPARTMENT_OTHER): Payer: Self-pay

## 2020-06-19 DIAGNOSIS — J45909 Unspecified asthma, uncomplicated: Secondary | ICD-10-CM | POA: Insufficient documentation

## 2020-06-19 DIAGNOSIS — E119 Type 2 diabetes mellitus without complications: Secondary | ICD-10-CM | POA: Insufficient documentation

## 2020-06-19 DIAGNOSIS — E039 Hypothyroidism, unspecified: Secondary | ICD-10-CM | POA: Insufficient documentation

## 2020-06-19 DIAGNOSIS — R102 Pelvic and perineal pain: Secondary | ICD-10-CM | POA: Insufficient documentation

## 2020-06-19 DIAGNOSIS — F17210 Nicotine dependence, cigarettes, uncomplicated: Secondary | ICD-10-CM | POA: Insufficient documentation

## 2020-06-19 DIAGNOSIS — Z79899 Other long term (current) drug therapy: Secondary | ICD-10-CM | POA: Insufficient documentation

## 2020-06-19 DIAGNOSIS — K439 Ventral hernia without obstruction or gangrene: Secondary | ICD-10-CM | POA: Insufficient documentation

## 2020-06-19 DIAGNOSIS — R3915 Urgency of urination: Secondary | ICD-10-CM | POA: Insufficient documentation

## 2020-06-19 DIAGNOSIS — M793 Panniculitis, unspecified: Secondary | ICD-10-CM

## 2020-06-19 DIAGNOSIS — Z7951 Long term (current) use of inhaled steroids: Secondary | ICD-10-CM | POA: Insufficient documentation

## 2020-06-19 DIAGNOSIS — Z7984 Long term (current) use of oral hypoglycemic drugs: Secondary | ICD-10-CM | POA: Insufficient documentation

## 2020-06-19 DIAGNOSIS — I10 Essential (primary) hypertension: Secondary | ICD-10-CM | POA: Insufficient documentation

## 2020-06-19 LAB — CBC WITH DIFFERENTIAL/PLATELET
Abs Immature Granulocytes: 0.2 10*3/uL — ABNORMAL HIGH (ref 0.00–0.07)
Basophils Absolute: 0.2 10*3/uL — ABNORMAL HIGH (ref 0.0–0.1)
Basophils Relative: 1 %
Eosinophils Absolute: 0.4 10*3/uL (ref 0.0–0.5)
Eosinophils Relative: 3 %
HCT: 41.8 % (ref 36.0–46.0)
Hemoglobin: 13.9 g/dL (ref 12.0–15.0)
Immature Granulocytes: 2 %
Lymphocytes Relative: 23 %
Lymphs Abs: 2.6 10*3/uL (ref 0.7–4.0)
MCH: 30.6 pg (ref 26.0–34.0)
MCHC: 33.3 g/dL (ref 30.0–36.0)
MCV: 92.1 fL (ref 80.0–100.0)
Monocytes Absolute: 0.6 10*3/uL (ref 0.1–1.0)
Monocytes Relative: 5 %
Neutro Abs: 7.6 10*3/uL (ref 1.7–7.7)
Neutrophils Relative %: 66 %
Platelets: 201 10*3/uL (ref 150–400)
RBC: 4.54 MIL/uL (ref 3.87–5.11)
RDW: 16.4 % — ABNORMAL HIGH (ref 11.5–15.5)
WBC: 11.6 10*3/uL — ABNORMAL HIGH (ref 4.0–10.5)
nRBC: 0.2 % (ref 0.0–0.2)

## 2020-06-19 LAB — COMPREHENSIVE METABOLIC PANEL
ALT: 21 U/L (ref 0–44)
AST: 43 U/L — ABNORMAL HIGH (ref 15–41)
Albumin: 4.2 g/dL (ref 3.5–5.0)
Alkaline Phosphatase: 87 U/L (ref 38–126)
Anion gap: 13 (ref 5–15)
BUN: 10 mg/dL (ref 6–20)
CO2: 25 mmol/L (ref 22–32)
Calcium: 9.1 mg/dL (ref 8.9–10.3)
Chloride: 99 mmol/L (ref 98–111)
Creatinine, Ser: 0.65 mg/dL (ref 0.44–1.00)
GFR calc Af Amer: >60 mL/min (ref >60)
GFR calc non Af Amer: >60 mL/min (ref >60)
Glucose, Bld: 183 mg/dL — ABNORMAL HIGH (ref 70–99)
Potassium: 3.3 mmol/L — ABNORMAL LOW (ref 3.5–5.1)
Sodium: 137 mmol/L (ref 135–145)
Total Bilirubin: 0.8 mg/dL (ref 0.3–1.2)
Total Protein: 8.2 g/dL — ABNORMAL HIGH (ref 6.5–8.1)

## 2020-06-19 LAB — LIPASE, BLOOD: Lipase: 33 U/L (ref 11–51)

## 2020-06-19 LAB — URINALYSIS, ROUTINE W REFLEX MICROSCOPIC
Bilirubin Urine: NEGATIVE
Glucose, UA: NEGATIVE mg/dL
Hgb urine dipstick: NEGATIVE
Ketones, ur: NEGATIVE mg/dL
Leukocytes,Ua: NEGATIVE
Nitrite: NEGATIVE
Protein, ur: 100 mg/dL — AB
Specific Gravity, Urine: 1.025 (ref 1.005–1.030)
pH: 6 (ref 5.0–8.0)

## 2020-06-19 LAB — URINALYSIS, MICROSCOPIC (REFLEX)

## 2020-06-19 LAB — PREGNANCY, URINE: Preg Test, Ur: NEGATIVE

## 2020-06-19 MED ORDER — IOHEXOL 300 MG/ML  SOLN
100.0000 mL | Freq: Once | INTRAMUSCULAR | Status: AC | PRN
  Administered 2020-06-19: 100 mL via INTRAVENOUS

## 2020-06-19 MED ORDER — SODIUM CHLORIDE 0.9 % IV BOLUS
1000.0000 mL | Freq: Once | INTRAVENOUS | Status: AC
  Administered 2020-06-19: 1000 mL via INTRAVENOUS

## 2020-06-19 MED ORDER — MORPHINE SULFATE (PF) 4 MG/ML IV SOLN
4.0000 mg | Freq: Once | INTRAVENOUS | Status: AC
  Administered 2020-06-19: 4 mg via INTRAVENOUS
  Filled 2020-06-19: qty 1

## 2020-06-19 MED ORDER — ONDANSETRON HCL 4 MG/2ML IJ SOLN
4.0000 mg | Freq: Once | INTRAMUSCULAR | Status: AC
  Administered 2020-06-19: 4 mg via INTRAVENOUS
  Filled 2020-06-19: qty 2

## 2020-06-19 NOTE — ED Notes (Signed)
Patient transported to CT 

## 2020-06-19 NOTE — ED Provider Notes (Signed)
[facility] EMERGENCY DEPARTMENT Provider Note   CSN: [id number] Arrival date & time: 06/19/20  2138     History Chief Complaint  Patient presents with  . Pelvic Pain    Melissa Rollins is a 39 y.o. female.  39 year old female presents with complaint of lower abdominal pain and urinary frequency since last night.  Patient states pain is worse with palpation of her lower abdomen.  Denies associated nausea, vomiting, changes in bowel habits or dysuria.  Pain does not radiate.  Patient is here with her husband, denies vaginal discharge or concerns for STDs.  No other complaints or concerns.        Past Medical History:  Diagnosis Date  . Asthma   . Diabetes mellitus without complication (HCC)   . Hypercholesterolemia   . Hypertension   . Morbid obesity (HCC)   . PTSD (post-traumatic stress disorder)   . Seasonal allergies   . Thyroid disease     Patient Active Problem List   Diagnosis Date Noted  . Sepsis due to urinary tract infection (HCC) 12/18/2019  . Hypertension   . Hypothyroidism   . Asthma   . Diabetes mellitus without complication (HCC)   . Morbid obesity (HCC)   . Pneumonia due to COVID-19 virus   . Acute respiratory failure with hypoxemia [facility])     Past Surgical History:  Procedure Laterality Date  . ABDOMINAL HYSTERECTOMY    . CHOLECYSTECTOMY    . THYROIDECTOMY       OB History   No obstetric history on file.     History reviewed. No pertinent family history.  Social History   Tobacco Use  . Smoking status: Current Every Day Smoker    Packs/day: 0.50    Types: Cigarettes  . Smokeless tobacco: Never Used  Vaping Use  . Vaping Use: Never used  Substance Use Topics  . Alcohol use: No  . Drug use: No    Home Medications Prior to Admission medications   Medication Sig Start Date End Date Taking? Authorizing Provider  albuterol (PROVENTIL HFA;VENTOLIN HFA) 108 (90 Base) MCG/ACT inhaler Inhale into the lungs. 07/03/16   [provider]  levothyroxine (SYNTHROID, LEVOTHROID) 200 MCG tablet Take 200 mcg by mouth daily before breakfast.    [provider]  lisinopril (PRINIVIL,ZESTRIL) 10 MG tablet Take 10 mg by mouth daily.    [provider]  metFORMIN (GLUCOPHAGE) 500 MG tablet Take 1,000 mg by mouth 2 (two) times daily with a meal.     [provider]  rosuvastatin (CRESTOR) 40 MG tablet Take 20 mg by mouth daily.  06/27/19   [provider]    Allergies    Ciprofloxacin hcl, Doxycycline, Promethazine, Promethazine hcl, Bactrim [sulfamethoxazole-trimethoprim], Ibuprofen, Limonene, Other, Cefdinir, Iodinated diagnostic agents, and Ciprofloxacin  Review of Systems   Review of Systems  Constitutional: Negative for chills and fever.  Respiratory: Negative for shortness of breath.   Cardiovascular: Negative for chest pain.  Gastrointestinal: Positive for abdominal pain. Negative for constipation, diarrhea, nausea and vomiting.  Genitourinary: Positive for frequency and urgency. Negative for dysuria.  Musculoskeletal: Negative for back pain.  Skin: Negative for rash and wound.  Allergic/Immunologic: Positive for immunocompromised state.  Neurological: Negative for weakness.  All other systems reviewed and are negative.   Physical Exam Updated Vital Signs BP (!) 186/101 (BP Location: Left Arm)   Pulse 98   Temp 99.1 F (37.3 C) (Oral)   Resp 18   Ht 5\' 7"  (1.702  m)   Wt (!) 152.4 kg   SpO2 100%   BMI 52.63 kg/m   Physical Exam Vitals and nursing note reviewed.  Constitutional:      General: She is not in acute distress.    Appearance: She is well-developed. She is obese. She is not diaphoretic.  HENT:     Head: Normocephalic and atraumatic.  Cardiovascular:     Rate and Rhythm: Normal rate and regular rhythm.     Pulses: Normal pulses.     Heart sounds: Normal heart sounds.  Pulmonary:     Effort: Pulmonary effort is normal.     Breath sounds: Normal  breath sounds.  Abdominal:     Palpations: Abdomen is soft.     Hernia: A hernia is present. Hernia is present in the umbilical area.     Comments: Umbilical hernia which is reduced without pain.  Patient has an area of induration/tenderness to the lower abdomen/pannus without overlying erythema.  Skin:    General: Skin is warm and dry.     Findings: No erythema or rash.  Neurological:     Mental Status: She is alert and oriented to person, place, and time.  Psychiatric:        Behavior: Behavior normal.     ED Results / Procedures / Treatments   Labs (all labs ordered are listed, but only abnormal results are displayed) Labs Reviewed  URINALYSIS, ROUTINE W REFLEX MICROSCOPIC - Abnormal; Notable for the following components:      Result Value   Protein, ur 100 (*)    All other components within normal limits  URINALYSIS, MICROSCOPIC (REFLEX) - Abnormal; Notable for the following components:   Bacteria, UA MANY (*)    All other components within normal limits  CBC WITH DIFFERENTIAL/PLATELET - Abnormal; Notable for the following components:   WBC 11.6 (*)    RDW 16.4 (*)    Basophils Absolute 0.2 (*)    Abs Immature Granulocytes 0.20 (*)    All other components within normal limits  COMPREHENSIVE METABOLIC PANEL  LIPASE, BLOOD  PREGNANCY, URINE    EKG None  Radiology No results found.  Procedures Procedures (including critical care time)  Medications Ordered in ED Medications  ondansetron (ZOFRAN) injection 4 mg (4 mg Intravenous Given 06/19/20 2256)  sodium chloride 0.9 % bolus 1,000 mL (1,000 mLs Intravenous New Bag/Given 06/19/20 2252)  morphine 4 MG/ML injection 4 mg (4 mg Intravenous Given 06/19/20 2258)    ED Course  I have reviewed the triage vital signs and the nursing notes.  Pertinent labs & imaging results that were available during my care of the patient were reviewed by me and considered in my medical decision making (see chart for details).  Clinical  Course as of Jun 20 2303  Sat Jun 19, 2020  [id number] 39 year old female with complaint of lower abdominal pain and urinary urgency onset last night.  On exam, patient is morbidly obese, has an obvious umbilical hernia which is nontender with palpation.  She has an area of induration inferior to this which is very tender without overlying skin changes.  Question extension of her hernia versus cellulitis or panniculitis.  Care signed out pending CT abdomen pelvis.   [LM]    Clinical Course User Index [LM] Alden Hipp   MDM Rules/Calculators/A&P                          Final Clinical Impression(s) /  ED Diagnoses Final diagnoses:  None    Rx / DC Orders ED Discharge Orders    None       Alden Hipp 06/19/20 2304    Nira Conn, MD 06/20/20 [phone]

## 2020-06-19 NOTE — ED Triage Notes (Signed)
Patient states that she started to have lower pelvic pain last night. The patient states that it hurts worse when she urinates. The patient states that she has a constant " gas bubble" type feeling. Denis any N/V

## 2020-06-20 MED ORDER — FLUCONAZOLE 100 MG PO TABS: 100.0000 mg | ORAL_TABLET | Freq: Every day | ORAL | 0 refills | Status: AC

## 2020-06-20 MED ORDER — CLINDAMYCIN HCL 150 MG PO CAPS: 450.0000 mg | ORAL_CAPSULE | Freq: Three times a day (TID) | ORAL | 0 refills | Status: AC

## 2020-06-20 MED ORDER — CLINDAMYCIN HCL 150 MG PO CAPS
450.0000 mg | ORAL_CAPSULE | Freq: Once | ORAL | Status: AC
  Administered 2020-06-20: 450 mg via ORAL
  Filled 2020-06-20: qty 3

## 2020-06-20 NOTE — ED Provider Notes (Signed)
Attestation: Medical screening examination/treatment/procedure(s) were conducted as a shared visit with non-physician practitioner(s) and myself.  I personally evaluated the patient during the encounter.   Briefly, the patient is a 39 y.o. female with h/o DM, here for lower abd pain.   Vitals:   06/19/20 2145 06/20/20 0023  BP: (!) 186/101 127/70  Pulse: 98 101  Resp: 18 18  Temp: 99.1 F (37.3 C)   SpO2: 100% 92%    CONSTITUTIONAL:  well-appearing, NAD NEURO:  Alert and oriented x 3, no focal deficits EYES:  pupils equal and reactive ENT/NECK:  trachea midline, no JVD CARDIO:  reg rate, reg rhythm, well-perfused PULM:  None labored breathing GI/GU:  Abdomen non-distended, ventral hernia with induration surrounding that it TTP. No erythema. MSK/SPINE:  No gross deformities, no edema SKIN:  no rash, atraumatic PSYCH:  Appropriate speech and behavior   EKG Interpretation  Date/Time:    Ventricular Rate:    PR Interval:    QRS Duration:   QT Interval:    QTC Calculation:   R Axis:     Text Interpretation:         Work up consistent with possible strangulation of the herniated fat with surrounding stranding concerning for cellulitis. No abscess.   CT also noted for possible enterocolitis, but she is not having GI sxs.  UA not consistent with infection.  Will treat with Clinda.   Nira Conn, MD 06/20/20 [phone]

## 2020-06-20 NOTE — ED Notes (Signed)
Pt c/o headache. States she thinks it is due to the IV dye for CT. EDP Cardama made aware.

## 2020-09-29 IMAGING — CT CT HEAD W/O CM
3 series · 16 of 47 positions shown, 19 images · non-contrast
Comparison: None.

CLINICAL DATA: Dizziness

EXAM:
CT HEAD WITHOUT CONTRAST
TECHNIQUE: Contiguous axial images were obtained from the base of the skull
through the vertex without intravenous contrast.

[Series 2: head wo · axial · 0.42mm/px · z∈[-163,-28]mm · 10 of 33 slices shown, 13 images]
[im 3/33  brain]
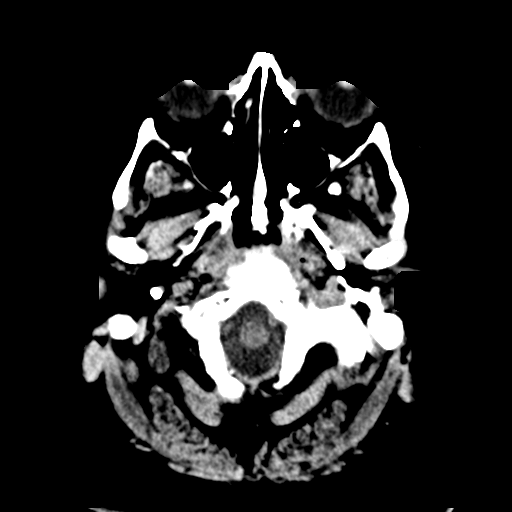
[im 3/33  bone]
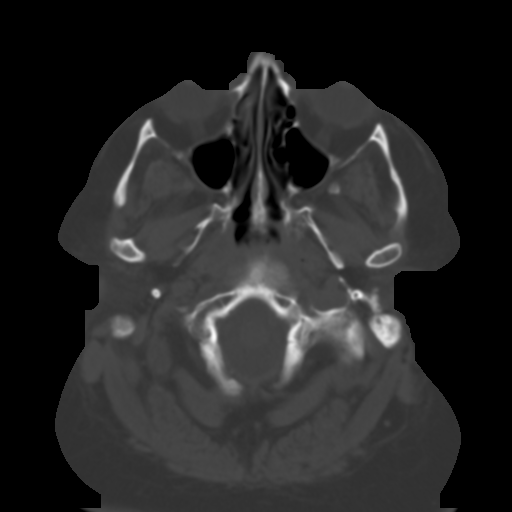
[im 6/33  brain]
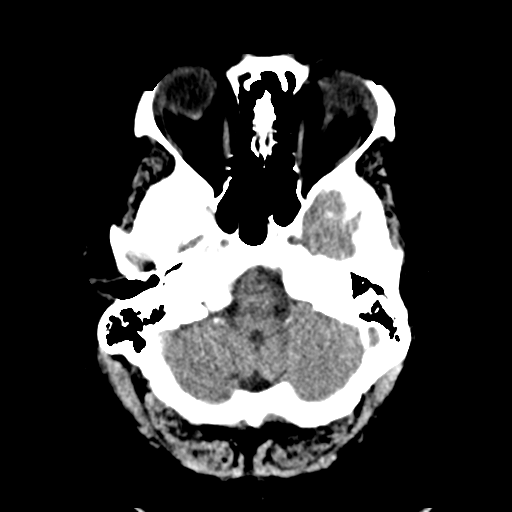
[im 9/33  brain]
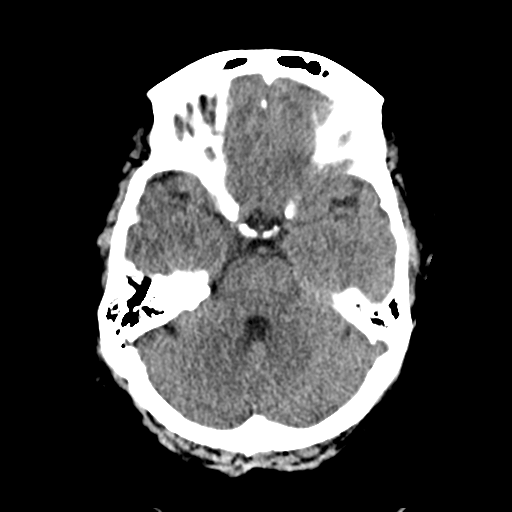
[im 12/33  brain]
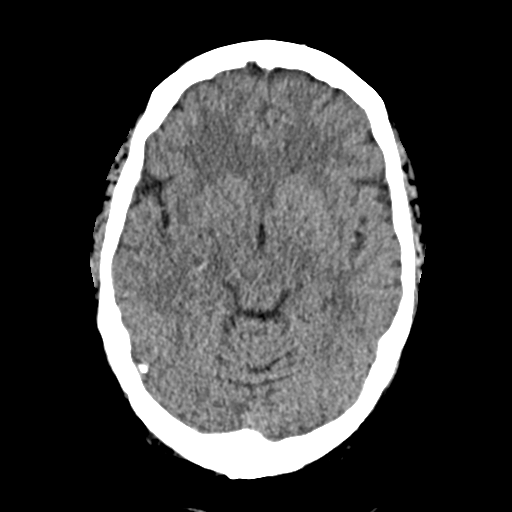
[im 15/33  brain]
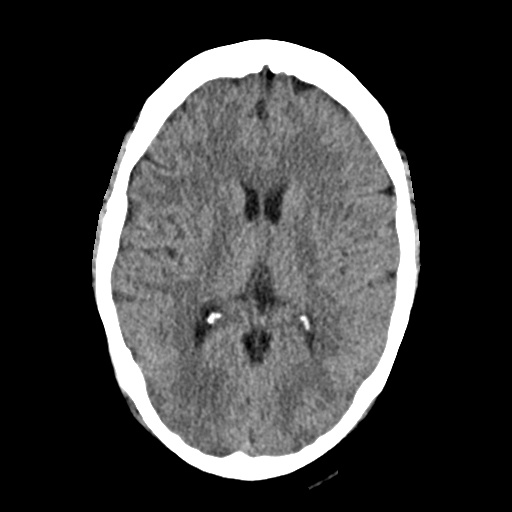
[im 15/33  bone]
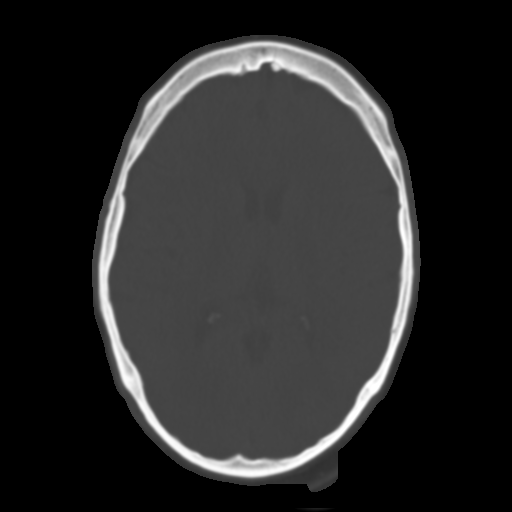
[im 18/33  brain]
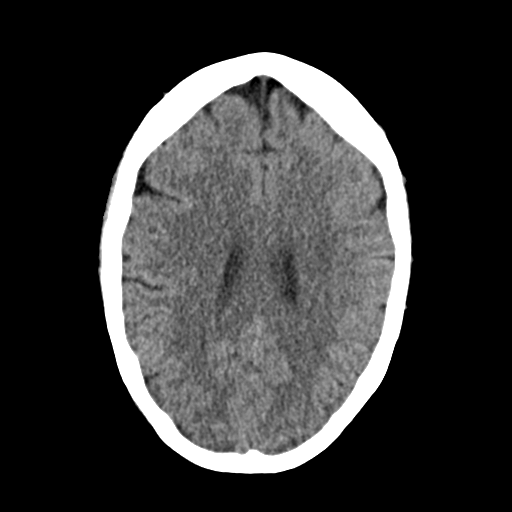
[im 21/33  brain]
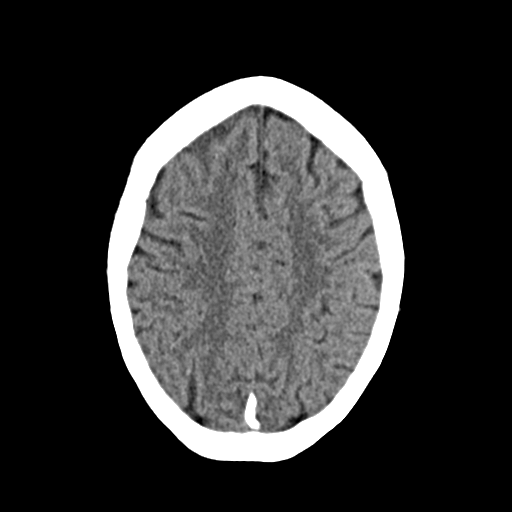
[im 25/33  brain]
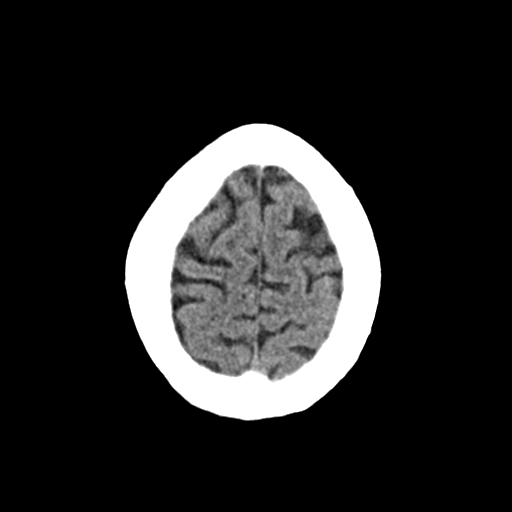
[im 27/33  brain]
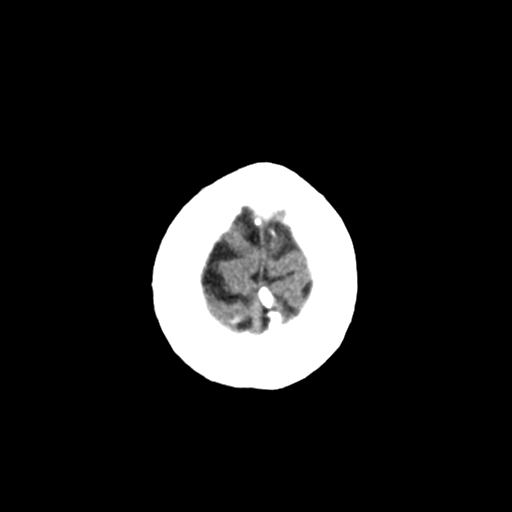
[im 27/33  bone]
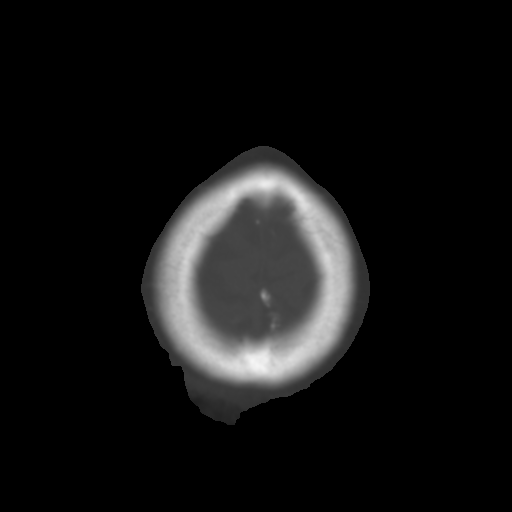
[im 30/33  brain]
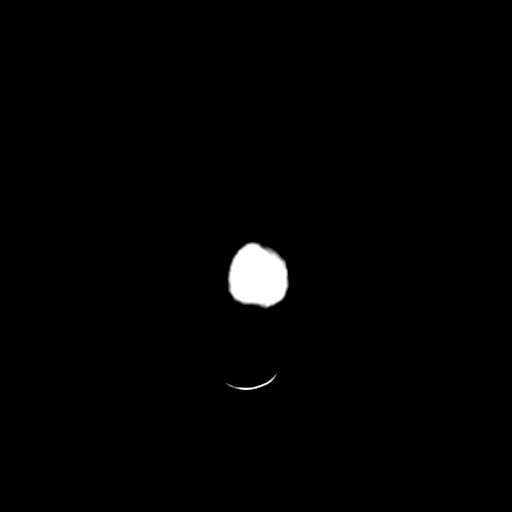

[Series 4: coronal soft · coronal · 0.34mm/px · 3 of 71 slices shown]
[im 24/71  brain]
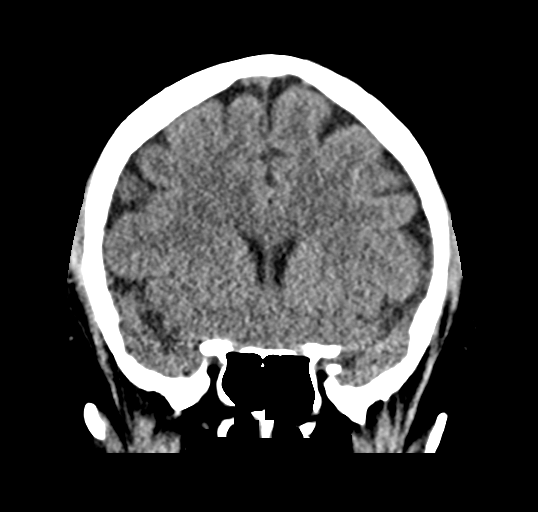
[im 32/71  brain]
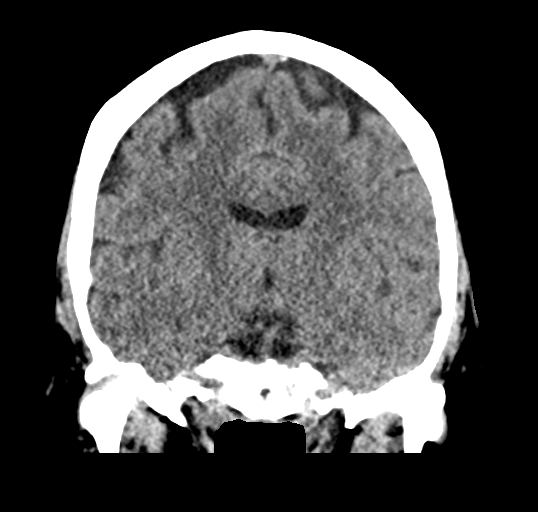
[im 39/71  brain]
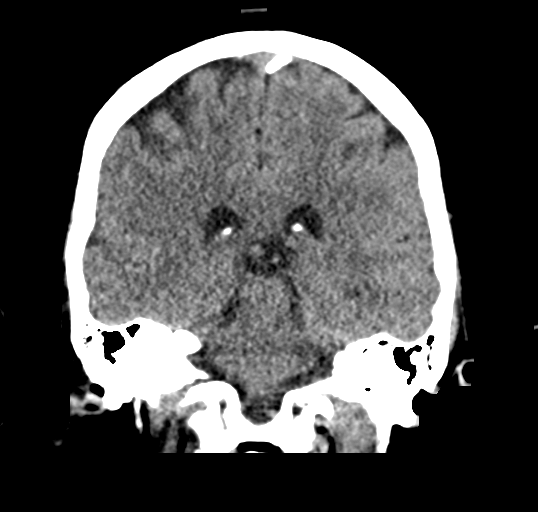

[Series 5: sag soft · sagittal · 0.32mm/px · 3 of 53 slices shown]
[im 18/53  brain]
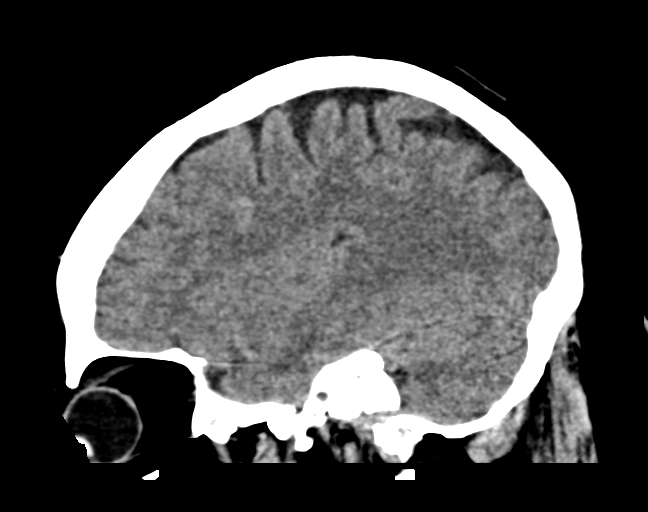
[im 27/53  brain]
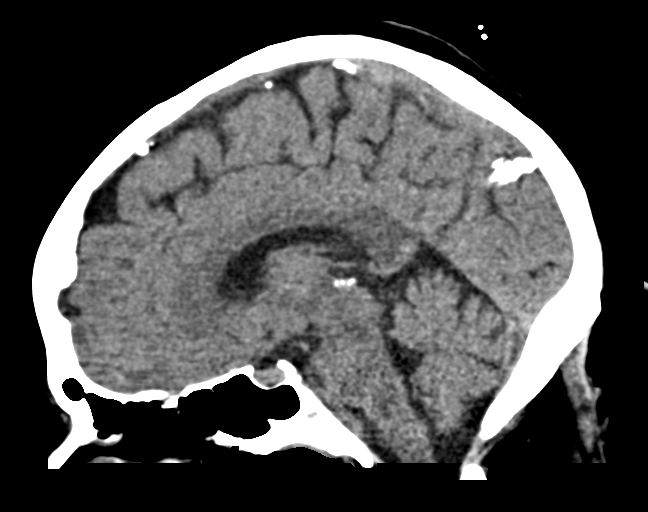
[im 35/53  brain]
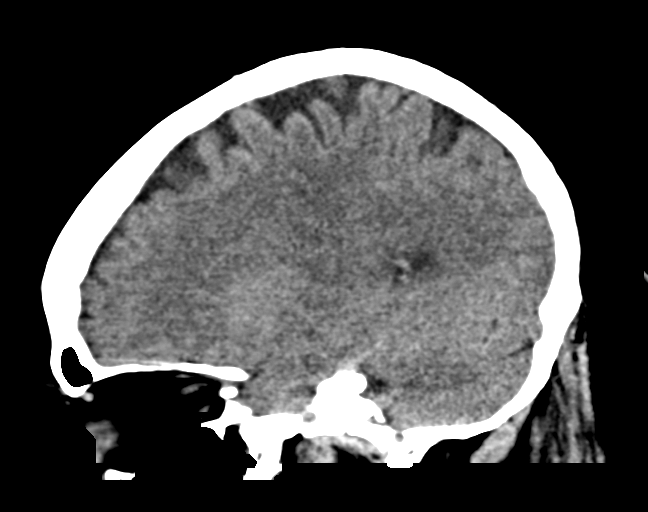

[16 of 47 positions shown; findings below may reference images not displayed]

FINDINGS: Brain: No evidence of acute infarction, hemorrhage, hydrocephalus,
extra-axial collection or mass lesion/mass effect.

Vascular: No hyperdense vessel or unexpected calcification.

Skull: Normal. Negative for fracture or focal lesion.

Sinuses/Orbits: No acute finding.

Other: None.
IMPRESSION: No acute intracranial findings.

## 2022-03-24 ENCOUNTER — Emergency Department (HOSPITAL_BASED_OUTPATIENT_CLINIC_OR_DEPARTMENT_OTHER): Payer: Self-pay

## 2022-03-24 ENCOUNTER — Emergency Department (HOSPITAL_BASED_OUTPATIENT_CLINIC_OR_DEPARTMENT_OTHER)
Admission: EM | Admit: 2022-03-24 | Discharge: 2022-03-24 | Disposition: A | Discharge status: Home / Self Care | Payer: Self-pay | Attending: Emergency Medicine | Admitting: Emergency Medicine

## 2022-03-24 ENCOUNTER — Encounter (HOSPITAL_BASED_OUTPATIENT_CLINIC_OR_DEPARTMENT_OTHER): Payer: Self-pay

## 2022-03-24 ENCOUNTER — Other Ambulatory Visit: Payer: Self-pay

## 2022-03-24 DIAGNOSIS — K5732 Diverticulitis of large intestine without perforation or abscess without bleeding: Secondary | ICD-10-CM | POA: Insufficient documentation

## 2022-03-24 DIAGNOSIS — K5792 Diverticulitis of intestine, part unspecified, without perforation or abscess without bleeding: Secondary | ICD-10-CM

## 2022-03-24 LAB — CBC WITH DIFFERENTIAL/PLATELET
Abs Immature Granulocytes: 0.15 10*3/uL — ABNORMAL HIGH (ref 0.00–0.07)
Basophils Absolute: 0.1 10*3/uL (ref 0.0–0.1)
Basophils Relative: 1 %
Eosinophils Absolute: 0.4 10*3/uL (ref 0.0–0.5)
Eosinophils Relative: 2 %
HCT: 46.9 % — ABNORMAL HIGH (ref 36.0–46.0)
Hemoglobin: 15.7 g/dL — ABNORMAL HIGH (ref 12.0–15.0)
Immature Granulocytes: 1 %
Lymphocytes Relative: 23 %
Lymphs Abs: 3.6 10*3/uL (ref 0.7–4.0)
MCH: 29.9 pg (ref 26.0–34.0)
MCHC: 33.5 g/dL (ref 30.0–36.0)
MCV: 89.3 fL (ref 80.0–100.0)
Monocytes Absolute: 1 10*3/uL (ref 0.1–1.0)
Monocytes Relative: 6 %
Neutro Abs: 10.8 10*3/uL — ABNORMAL HIGH (ref 1.7–7.7)
Neutrophils Relative %: 67 %
Platelets: 228 10*3/uL (ref 150–400)
RBC: 5.25 MIL/uL — ABNORMAL HIGH (ref 3.87–5.11)
RDW: 15.8 % — ABNORMAL HIGH (ref 11.5–15.5)
WBC: 16 10*3/uL — ABNORMAL HIGH (ref 4.0–10.5)
nRBC: 0 % (ref 0.0–0.2)

## 2022-03-24 LAB — COMPREHENSIVE METABOLIC PANEL
ALT: 41 U/L (ref 0–44)
AST: 45 U/L — ABNORMAL HIGH (ref 15–41)
Albumin: 4.1 g/dL (ref 3.5–5.0)
Alkaline Phosphatase: 144 U/L — ABNORMAL HIGH (ref 38–126)
Anion gap: 9 (ref 5–15)
BUN: 12 mg/dL (ref 6–20)
CO2: 23 mmol/L (ref 22–32)
Calcium: 9.3 mg/dL (ref 8.9–10.3)
Chloride: 105 mmol/L (ref 98–111)
Creatinine, Ser: 0.78 mg/dL (ref 0.44–1.00)
GFR, Estimated: >60 mL/min (ref >60)
Glucose, Bld: 162 mg/dL — ABNORMAL HIGH (ref 70–99)
Potassium: 3.6 mmol/L (ref 3.5–5.1)
Sodium: 137 mmol/L (ref 135–145)
Total Bilirubin: 0.4 mg/dL (ref 0.3–1.2)
Total Protein: 8.1 g/dL (ref 6.5–8.1)

## 2022-03-24 LAB — URINALYSIS, MICROSCOPIC (REFLEX)

## 2022-03-24 LAB — URINALYSIS, ROUTINE W REFLEX MICROSCOPIC
Bilirubin Urine: NEGATIVE
Glucose, UA: >=500 mg/dL — AB
Ketones, ur: NEGATIVE mg/dL
Leukocytes,Ua: NEGATIVE
Nitrite: NEGATIVE
Protein, ur: 30 mg/dL — AB
Specific Gravity, Urine: 1.02 (ref 1.005–1.030)
pH: 5.5 (ref 5.0–8.0)

## 2022-03-24 LAB — LIPASE, BLOOD: Lipase: 42 U/L (ref 11–51)

## 2022-03-24 MED ORDER — MORPHINE SULFATE (PF) 4 MG/ML IV SOLN
4.0000 mg | Freq: Once | INTRAVENOUS | Status: AC
  Administered 2022-03-24: 4 mg via INTRAVENOUS
  Filled 2022-03-24: qty 1

## 2022-03-24 MED ORDER — ONDANSETRON HCL 4 MG/2ML IJ SOLN
4.0000 mg | Freq: Once | INTRAMUSCULAR | Status: AC
  Administered 2022-03-24: 4 mg via INTRAVENOUS
  Filled 2022-03-24: qty 2

## 2022-03-24 MED ORDER — AMOXICILLIN-POT CLAVULANATE 875-125 MG PO TABS
1.0000 | ORAL_TABLET | Freq: Once | ORAL | Status: AC
Start: 2022-03-24 — End: 2022-03-24
  Administered 2022-03-24: 1 via ORAL
  Filled 2022-03-24: qty 1

## 2022-03-24 MED ORDER — FLUCONAZOLE 150 MG PO TABS
150.0000 mg | ORAL_TABLET | Freq: Once | ORAL | Status: AC
Start: 2022-03-24 — End: 2022-03-24
  Administered 2022-03-24: 150 mg via ORAL
  Filled 2022-03-24: qty 1

## 2022-03-24 MED ORDER — AMOXICILLIN-POT CLAVULANATE 875-125 MG PO TABS
1.0000 | ORAL_TABLET | Freq: Two times a day (BID) | ORAL | 0 refills | Status: DC
Start: 2022-03-24 — End: 2022-08-29

## 2022-03-24 MED ORDER — SODIUM CHLORIDE 0.9 % IV BOLUS
1000.0000 mL | Freq: Once | INTRAVENOUS | Status: AC
  Administered 2022-03-24: 1000 mL via INTRAVENOUS

## 2022-03-24 MED ORDER — MORPHINE SULFATE 15 MG PO TABS: 7.5000 mg | ORAL_TABLET | ORAL | 0 refills | Status: AC | PRN

## 2022-03-24 MED ORDER — ONDANSETRON 4 MG PO TBDP: ORAL_TABLET | ORAL | 0 refills | Status: DC

## 2022-03-24 NOTE — ED Triage Notes (Signed)
LLQ abdominal pain that started at 20:00 tonight. Hx of diverticulitis. Denies fever, N/V/D.  ?

## 2022-03-24 NOTE — ED Provider Notes (Signed)
?[facility] EMERGENCY DEPARTMENT ?Provider Note ? ? ?CSN: [id number] ?Arrival date & time: 03/24/22  0203 ? ?  ? ?History ? ?Chief Complaint  ?Patient presents with  ? Abdominal Pain  ? ? ?Melissa Rollins is a 41 y.o. female. ? ?41 yo F with a cc of LLQ abdominal pain.  Started about 4 hours ago.  Started while at work.  Worse with movement, palpation.  Subjective fevers and increased urination past 48 hours.  ? ? ?Abdominal Pain ? ?  ? ?Home Medications ?Prior to Admission medications   ?Medication Sig Start Date End Date Taking? Authorizing Provider  ?amoxicillin-clavulanate (AUGMENTIN) 875-125 MG tablet Take 1 tablet by mouth every 12 (twelve) hours. 03/24/22  Yes Melene PlanFloyd, Argie Lober, DO  ?morphine (MSIR) 15 MG tablet Take 0.5 tablets (7.5 mg total) by mouth every 4 (four) hours as needed for severe pain. 03/24/22  Yes Melene PlanFloyd, Rasul Decola, DO  ?ondansetron (ZOFRAN-ODT) 4 MG disintegrating tablet 4mg  ODT q4 hours prn nausea/vomit 03/24/22  Yes Melene PlanFloyd, Jashira Cotugno, DO  ?albuterol (PROVENTIL HFA;VENTOLIN HFA) 108 (90 Base) MCG/ACT inhaler Inhale into the lungs. 07/03/16   [provider]  ?levothyroxine (SYNTHROID, LEVOTHROID) 200 MCG tablet Take 200 mcg by mouth daily before breakfast.    [provider]  ?lisinopril (PRINIVIL,ZESTRIL) 10 MG tablet Take 10 mg by mouth daily.    [provider]  ?metFORMIN (GLUCOPHAGE) 500 MG tablet Take 1,000 mg by mouth 2 (two) times daily with a meal.     [provider]  ?rosuvastatin (CRESTOR) 40 MG tablet Take 20 mg by mouth daily.  06/27/19   [provider]  ?   ? ?Allergies    ?Ciprofloxacin hcl, Doxycycline, Promethazine hcl, Bactrim [sulfamethoxazole-trimethoprim], Ibuprofen, Limonene, Cefdinir, and Iodinated contrast media   ? ?Review of Systems   ?Review of Systems  ?Gastrointestinal:  Positive for abdominal pain.  ? ?Physical Exam ?Updated Vital Signs ?BP (!) 164/99 (BP Location: Right Arm)   Pulse (!) 101   Temp 98.2 ?F (36.8 ?C) (Oral)   Resp 20    Ht 5\' 7"  (1.702 m)   Wt 132.9 kg   SpO2 98%   BMI 45.89 kg/m?  ?Physical Exam ?Vitals and nursing note reviewed.  ?Constitutional:   ?   General: She is not in acute distress. ?   Appearance: She is well-developed. She is not diaphoretic.  ?HENT:  ?   Head: Normocephalic and atraumatic.  ?Eyes:  ?   Pupils: Pupils are equal, round, and reactive to light.  ?Cardiovascular:  ?   Rate and Rhythm: Normal rate and regular rhythm.  ?   Heart sounds: No murmur heard. ?  No friction rub. No gallop.  ?Pulmonary:  ?   Effort: Pulmonary effort is normal.  ?   Breath sounds: No wheezing or rales.  ?Abdominal:  ?   General: There is no distension.  ?   Palpations: Abdomen is soft.  ?   Tenderness: There is abdominal tenderness.  ?   Comments: Focal pain to the LLQ with guarding  ?Musculoskeletal:     ?   General: No tenderness.  ?   Cervical back: Normal range of motion and neck supple.  ?Skin: ?   General: Skin is warm and dry.  ?Neurological:  ?   Mental Status: She is alert and oriented to person, place, and time.  ?Psychiatric:     ?   Behavior: Behavior normal.  ? ? ?ED Results / Procedures / Treatments   ?Labs ?(all  labs ordered are listed, but only abnormal results are displayed) ?Labs Reviewed  ?CBC WITH DIFFERENTIAL/PLATELET - Abnormal; Notable for the following components:  ?    Result Value  ? WBC 16.0 (*)   ? RBC 5.25 (*)   ? Hemoglobin 15.7 (*)   ? HCT 46.9 (*)   ? RDW 15.8 (*)   ? Neutro Abs 10.8 (*)   ? Abs Immature Granulocytes 0.15 (*)   ? All other components within normal limits  ?COMPREHENSIVE METABOLIC PANEL - Abnormal; Notable for the following components:  ? Glucose, Bld 162 (*)   ? AST 45 (*)   ? Alkaline Phosphatase 144 (*)   ? All other components within normal limits  ?URINALYSIS, ROUTINE W REFLEX MICROSCOPIC - Abnormal; Notable for the following components:  ? Glucose, UA >=500 (*)   ? Hgb urine dipstick TRACE (*)   ? Protein, ur 30 (*)   ? All other components within normal limits  ?URINALYSIS,  MICROSCOPIC (REFLEX) - Abnormal; Notable for the following components:  ? Bacteria, UA MANY (*)   ? All other components within normal limits  ?LIPASE, BLOOD  ? ? ?EKG ?None ? ?Radiology ?CT ABDOMEN PELVIS WO CONTRAST ? ?Result Date: 03/24/2022 ?CLINICAL DATA:  Left lower quadrant abdominal pain EXAM: CT ABDOMEN AND PELVIS WITHOUT CONTRAST TECHNIQUE: Multidetector CT imaging of the abdomen and pelvis was performed following the standard protocol without IV contrast. RADIATION DOSE REDUCTION: This exam was performed according to the departmental dose-optimization program which includes automated exposure control, adjustment of the mA and/or kV according to patient size and/or use of iterative reconstruction technique. COMPARISON:  06/19/2020 FINDINGS: Lower chest: Lingular scarring/atelectasis. Hepatobiliary: Nodular hepatic contour, suggesting cirrhosis. No focal hepatic lesion is seen. Status post cholecystectomy. No intrahepatic or extrahepatic duct dilatation. Pancreas: Within normal limits. Spleen: Within normal limits. Adrenals/Urinary Tract: 2.8 cm left adrenal adenoma (series 3/image 21), benign. No follow-up is recommended. Right adrenal gland is within normal limits. Right kidney is within normal limits. Two nonobstructing left lower pole renal calculi measuring up to 2 mm (series 3/image 46). No hydronephrosis. Bladder is underdistended but unremarkable. Stomach/Bowel: Stomach is within normal limits. No evidence of bowel obstruction. Normal appendix (series 3/image 31). Mild left colonic diverticulosis, with pericolonic inflammatory changes along the distal descending colon (series 3/image 86), suggesting mild diverticulitis. No drainable fluid collection/abscess.  No free air. Vascular/Lymphatic: No evidence of abdominal aortic aneurysm. Atherosclerotic calcifications of the abdominal aorta and branch vessels. No suspicious abdominopelvic lymphadenopathy. Reproductive: Status post hysterectomy. Bilateral  ovaries are within normal limits. Other: No abdominopelvic ascites. Musculoskeletal: Visualized osseous structures are within normal limits. IMPRESSION: Mild left colonic diverticulitis. No drainable fluid collection/abscess. No free air. Cirrhosis. Two nonobstructing left lower pole renal calculi measuring up to 2 mm. No hydronephrosis. Electronically Signed   By: Charline Bills M.D.   On: 03/24/2022 03:11   ? ?Procedures ?Procedures  ? ? ?Medications Ordered in ED ?Medications  ?amoxicillin-clavulanate (AUGMENTIN) 875-125 MG per tablet 1 tablet (has no administration in time range)  ?fluconazole (DIFLUCAN) tablet 150 mg (has no administration in time range)  ?sodium chloride 0.9 % bolus 1,000 mL (1,000 mLs Intravenous Bolus 03/24/22 0238)  ?morphine (PF) 4 MG/ML injection 4 mg (4 mg Intravenous Given 03/24/22 0239)  ?ondansetron [facility]) injection 4 mg (4 mg Intravenous Given 03/24/22 0239)  ? ? ?ED Course/ Medical Decision Making/ A&P ?  ?                        ?  Medical Decision Making ?Amount and/or Complexity of Data Reviewed ?Labs: ordered. ?Radiology: ordered. ? ?Risk ?Prescription drug management. ? ? ?41 yo F with a chief complaints of left lower quadrant abdominal discomfort.  This started this evening.  She has a history of diverticulitis and thinks this feels somewhat similar.  She denies fevers nausea vomiting or diarrhea then states that she has felt subjectively hot and cold the past 48 hours checked the temperature and it was never over 99.  She has had increased urinary frequency.  Denies any pain with urination. ? ?UA interpreted by me negative for infection.  CT scan with diverticulitis.  No signs of complication.  No leukocytosis no LFT elevation no significant electrolyte abnormality.  We will have the patient follow-up with her family doctor. ? ?The patient with multiple antibiotic allergies.  She did tell me that she thought she would be okay taking Augmentin. ? ?3:23 AM:  I have discussed  the diagnosis/risks/treatment options with the patient.  Evaluation and diagnostic testing in the emergency department does not suggest an emergent condition requiring admission or immediate intervention beyond

## 2022-03-24 NOTE — Discharge Instructions (Signed)
Follow up with your doctor.  Return for worsening pain, fever.  ? ?Take 4 over the counter ibuprofen tablets 3 times a day or 2 over-the-counter naproxen tablets twice a day for pain. ?Also take tylenol 1000mg (2 extra strength) four times a day.  ? ?Then take the pain medicine if you feel like you need it. Narcotics do not help with the pain, they only make you care about it less.  You can become addicted to this, people may break into your house to steal it.  It will constipate you.  If you drive under the influence of this medicine you can get a DUI.   ? ?

## 2022-07-02 ENCOUNTER — Encounter (HOSPITAL_BASED_OUTPATIENT_CLINIC_OR_DEPARTMENT_OTHER): Payer: Self-pay | Admitting: Emergency Medicine

## 2022-07-02 ENCOUNTER — Other Ambulatory Visit: Payer: Self-pay

## 2022-07-02 ENCOUNTER — Emergency Department (HOSPITAL_BASED_OUTPATIENT_CLINIC_OR_DEPARTMENT_OTHER): Payer: No Typology Code available for payment source

## 2022-07-02 ENCOUNTER — Emergency Department (HOSPITAL_BASED_OUTPATIENT_CLINIC_OR_DEPARTMENT_OTHER)
Admission: EM | Admit: 2022-07-02 | Discharge: 2022-07-02 | Disposition: A | Discharge status: Home / Self Care | Payer: No Typology Code available for payment source | Attending: Emergency Medicine | Admitting: Emergency Medicine

## 2022-07-02 DIAGNOSIS — M79675 Pain in left toe(s): Secondary | ICD-10-CM | POA: Diagnosis present

## 2022-07-02 DIAGNOSIS — I10 Essential (primary) hypertension: Secondary | ICD-10-CM | POA: Insufficient documentation

## 2022-07-02 DIAGNOSIS — M109 Gout, unspecified: Secondary | ICD-10-CM | POA: Diagnosis not present

## 2022-07-02 DIAGNOSIS — E1140 Type 2 diabetes mellitus with diabetic neuropathy, unspecified: Secondary | ICD-10-CM | POA: Insufficient documentation

## 2022-07-02 DIAGNOSIS — E039 Hypothyroidism, unspecified: Secondary | ICD-10-CM | POA: Diagnosis not present

## 2022-07-02 DIAGNOSIS — Z79899 Other long term (current) drug therapy: Secondary | ICD-10-CM | POA: Insufficient documentation

## 2022-07-02 DIAGNOSIS — Z7984 Long term (current) use of oral hypoglycemic drugs: Secondary | ICD-10-CM | POA: Insufficient documentation

## 2022-07-02 MED ORDER — HYDROCODONE-ACETAMINOPHEN 5-325 MG PO TABS
1.0000 | ORAL_TABLET | Freq: Once | ORAL | Status: DC
Start: 2022-07-02 — End: 2022-07-02
  Filled 2022-07-02: qty 1

## 2022-07-02 MED ORDER — INDOMETHACIN 50 MG PO CAPS: 50.0000 mg | ORAL_CAPSULE | Freq: Three times a day (TID) | ORAL | 0 refills | Status: AC

## 2022-07-02 MED ORDER — INDOMETHACIN 25 MG PO CAPS
50.0000 mg | ORAL_CAPSULE | Freq: Once | ORAL | Status: AC
  Administered 2022-07-02: 50 mg via ORAL
  Filled 2022-07-02: qty 2

## 2022-07-02 NOTE — ED Notes (Signed)
Patient states pain started in her left feet yesterday. Has limited movement with out pain. Alert x 4

## 2022-07-02 NOTE — ED Provider Notes (Signed)
[facility] EMERGENCY DEPARTMENT Provider Note   CSN: [id number] Arrival date & time: 07/02/22  1129     History  Chief Complaint  Patient presents with   Foot Pain    Melissa Rollins is a 41 y.o. female with a past medical history of hypothyroidism, hypertension and type 2 diabetes resulting in peripheral neuropathy presenting today with left big toe pain.  Says that it started as soon as she woke up this morning.  No known injury or trauma but she says she has nearly no feeling in her feet secondary to diabetic neuropathy.  Says the pain is excruciating, even with light touch.   Foot Pain       Home Medications Prior to Admission medications   Medication Sig Start Date End Date Taking? Authorizing Provider  indomethacin (INDOCIN) 50 MG capsule Take 1 capsule (50 mg total) by mouth 3 (three) times daily with meals for 5 days. 07/02/22 07/07/22 Yes Cassia Fein A, PA-C  albuterol (PROVENTIL HFA;VENTOLIN HFA) 108 (90 Base) MCG/ACT inhaler Inhale into the lungs. 07/03/16   [provider]  amoxicillin-clavulanate (AUGMENTIN) 875-125 MG tablet Take 1 tablet by mouth every 12 (twelve) hours. 03/24/22   Melene Plan, DO  levothyroxine (SYNTHROID, LEVOTHROID) 200 MCG tablet Take 200 mcg by mouth daily before breakfast.    [provider]  lisinopril (PRINIVIL,ZESTRIL) 10 MG tablet Take 10 mg by mouth daily.    [provider]  metFORMIN (GLUCOPHAGE) 500 MG tablet Take 1,000 mg by mouth 2 (two) times daily with a meal.     [provider]  morphine (MSIR) 15 MG tablet Take 0.5 tablets (7.5 mg total) by mouth every 4 (four) hours as needed for severe pain. 03/24/22   Melene Plan, DO  ondansetron (ZOFRAN-ODT) 4 MG disintegrating tablet 4mg  ODT q4 hours prn nausea/vomit 03/24/22   03/26/22, DO  rosuvastatin (CRESTOR) 40 MG tablet Take 20 mg by mouth daily.  06/27/19   [provider]      Allergies    Ciprofloxacin hcl, Doxycycline, Promethazine  hcl, Bactrim [sulfamethoxazole-trimethoprim], Ibuprofen, Limonene, Cefdinir, and Iodinated contrast media    Review of Systems   Review of Systems  Physical Exam Updated Vital Signs BP (!) 140/96 (BP Location: Left Arm)   Pulse 80   Temp 98 F (36.7 C) (Oral)   Resp 20   Ht 5\' 7"  (1.702 m)   Wt 134.7 kg   SpO2 96%   BMI 46.52 kg/m  Physical Exam Vitals and nursing note reviewed.  Constitutional:      Appearance: Normal appearance.  HENT:     Head: Normocephalic and atraumatic.  Eyes:     General: No scleral icterus.    Conjunctiva/sclera: Conjunctivae normal.  Pulmonary:     Effort: Pulmonary effort is normal. No respiratory distress.  Musculoskeletal:     Comments: Tenderness, inflammation and erythema to the first left MTP.  Multiple calluses on the plantar surfaces of bilateral feet.  Strong DP pulse bilaterally.  No signs of lacerations, puncture wounds or skin trauma.  Skin:    Findings: No rash.  Neurological:     Mental Status: She is alert.  Psychiatric:        Mood and Affect: Mood normal.     ED Results / Procedures / Treatments   Labs (all labs ordered are listed, but only abnormal results are displayed) Labs Reviewed - No data to display  EKG None  Radiology No results found.  Procedures Procedures  Medications Ordered in ED Medications  indomethacin (INDOCIN) capsule 50 mg (50 mg Oral Given 07/02/22 1215)    ED Course/ Medical Decision Making/ A&P                           Medical Decision Making Risk Prescription drug management.   41 year old female presenting today due to atraumatic left big toe pain.  Started this morning.  Differential includes but is not limited to gout, septic joint, AVN, arthritis and lupus.  Physical exam: Consistent with gout  Treatment: Given first dose of indomethacin.  No steroids secondary to type 2 diabetes  MDM/disposition: Patient has no signs and her vitals or physical exam of septic joint.   Presentation most consistent with gout.  Neurovascularly intact.  I believe she is stable for discharge with indomethacin.  She is agreeable to this plan.  She will follow-up with her PCP as needed.  Return precautions discussed and all questions were answered.  Discharged at this time.  Final Clinical Impression(s) / ED Diagnoses Final diagnoses:  Acute gout involving toe of left foot, unspecified cause    Rx / DC Orders ED Discharge Orders          Ordered    indomethacin (INDOCIN) 50 MG capsule  3 times daily with meals        07/02/22 1203           Results and diagnoses were explained to the patient. Return precautions discussed in full. Patient had no additional questions and expressed complete understanding.   This chart was dictated using voice recognition software.  Despite best efforts to proofread,  errors can occur which can change the documentation meaning.    Woodroe Chen 07/02/22 1226    Terrilee Files, MD 07/02/22 1721

## 2022-07-02 NOTE — ED Triage Notes (Signed)
Pt arrive spov, slow gait, c/o LT medial foot pain that started last night. Denies injury. Excedrin at 0430 with no relief

## 2022-07-02 NOTE — Discharge Instructions (Addendum)
Your work note is attached.  With the information about gout attached to these discharge papers as well.  Indomethacin is the medication similar to ibuprofen, naproxen and aspirin.  Do not take it at the same time as these medications.  You may take Tylenol as well.   Follow-up with your PCP if you are not improving by the end of this week.

## 2022-08-29 ENCOUNTER — Other Ambulatory Visit (HOSPITAL_BASED_OUTPATIENT_CLINIC_OR_DEPARTMENT_OTHER): Payer: Self-pay

## 2022-08-29 ENCOUNTER — Encounter (HOSPITAL_BASED_OUTPATIENT_CLINIC_OR_DEPARTMENT_OTHER): Payer: Self-pay | Admitting: Pediatrics

## 2022-08-29 ENCOUNTER — Other Ambulatory Visit: Payer: Self-pay

## 2022-08-29 ENCOUNTER — Emergency Department (HOSPITAL_BASED_OUTPATIENT_CLINIC_OR_DEPARTMENT_OTHER)
Admission: EM | Admit: 2022-08-29 | Discharge: 2022-08-29 | Disposition: A | Discharge status: Home / Self Care | Payer: No Typology Code available for payment source | Attending: Emergency Medicine | Admitting: Emergency Medicine

## 2022-08-29 DIAGNOSIS — J111 Influenza due to unidentified influenza virus with other respiratory manifestations: Secondary | ICD-10-CM | POA: Insufficient documentation

## 2022-08-29 DIAGNOSIS — Z20822 Contact with and (suspected) exposure to covid-19: Secondary | ICD-10-CM | POA: Insufficient documentation

## 2022-08-29 DIAGNOSIS — E119 Type 2 diabetes mellitus without complications: Secondary | ICD-10-CM | POA: Insufficient documentation

## 2022-08-29 DIAGNOSIS — R0981 Nasal congestion: Secondary | ICD-10-CM | POA: Insufficient documentation

## 2022-08-29 DIAGNOSIS — J019 Acute sinusitis, unspecified: Secondary | ICD-10-CM | POA: Insufficient documentation

## 2022-08-29 DIAGNOSIS — R519 Headache, unspecified: Secondary | ICD-10-CM | POA: Insufficient documentation

## 2022-08-29 DIAGNOSIS — Z7984 Long term (current) use of oral hypoglycemic drugs: Secondary | ICD-10-CM | POA: Insufficient documentation

## 2022-08-29 LAB — RESP PANEL BY RT-PCR (FLU A&B, COVID) ARPGX2
Influenza A by PCR: NEGATIVE
Influenza B by PCR: NEGATIVE
SARS Coronavirus 2 by RT PCR: NEGATIVE

## 2022-08-29 MED ORDER — AMOXICILLIN-POT CLAVULANATE 875-125 MG PO TABS
1.0000 | ORAL_TABLET | Freq: Two times a day (BID) | ORAL | 0 refills | Status: DC
  Filled 2022-08-29: qty 14, 7d supply, fill #0

## 2022-08-29 MED ORDER — KETOROLAC TROMETHAMINE 60 MG/2ML IM SOLN
30.0000 mg | Freq: Once | INTRAMUSCULAR | Status: AC
  Administered 2022-08-29: 30 mg via INTRAMUSCULAR
  Filled 2022-08-29: qty 2

## 2022-08-29 MED ORDER — DEXAMETHASONE 4 MG PO TABS
10.0000 mg | ORAL_TABLET | Freq: Once | ORAL | Status: AC
  Administered 2022-08-29: 10 mg via ORAL
  Filled 2022-08-29: qty 3

## 2022-08-29 MED ORDER — AMOXICILLIN-POT CLAVULANATE 875-125 MG PO TABS: 1.0000 | ORAL_TABLET | Freq: Two times a day (BID) | ORAL | 0 refills | Status: AC

## 2022-08-29 NOTE — ED Provider Notes (Signed)
[facility] Provider Note   CSN: [id number] Arrival date & time: 08/29/22  1228     History  Chief Complaint  Patient presents with   Generalized Body Aches    Melissa Rollins is a 40 y.o. female.  Patient here with generalized body aches, sinus congestion for the last day.  Had some diarrhea that resolved.  Overall feels very achy.  No sick contacts or suspicious food intake.  History of diabetes.  Denies any abdominal pain, nausea, vomiting.  Has had sinus congestion and headaches.  Over-the-counter medications have helped a little bit.  Nothing makes it worse.  She feels tired.  The history is provided by the patient.       Home Medications Prior to Admission medications   Medication Sig Start Date End Date Taking? Authorizing Provider  amoxicillin-clavulanate (AUGMENTIN) 875-125 MG tablet Take 1 tablet by mouth every 12 (twelve) hours. 08/29/22  Yes Dquan Cortopassi, DO  albuterol (PROVENTIL HFA;VENTOLIN HFA) 108 (90 Base) MCG/ACT inhaler Inhale into the lungs. 07/03/16   [provider]  levothyroxine (SYNTHROID, LEVOTHROID) 200 MCG tablet Take 200 mcg by mouth daily before breakfast.    [provider]  lisinopril (PRINIVIL,ZESTRIL) 10 MG tablet Take 10 mg by mouth daily.    [provider]  metFORMIN (GLUCOPHAGE) 500 MG tablet Take 1,000 mg by mouth 2 (two) times daily with a meal.     [provider]  morphine (MSIR) 15 MG tablet Take 0.5 tablets (7.5 mg total) by mouth every 4 (four) hours as needed for severe pain. 03/24/22   Deno Etienne, DO  ondansetron (ZOFRAN-ODT) 4 MG disintegrating tablet 4mg  ODT q4 hours prn nausea/vomit 03/24/22   Deno Etienne, DO  rosuvastatin (CRESTOR) 40 MG tablet Take 20 mg by mouth daily.  06/27/19   [provider]      Allergies    Ciprofloxacin hcl, Doxycycline, Promethazine hcl, Bactrim [sulfamethoxazole-trimethoprim], Ibuprofen, Limonene, Cefdinir, and Iodinated contrast media     Review of Systems   Review of Systems  Physical Exam Updated Vital Signs BP (!) 140/84 (BP Location: Left Arm)   Pulse 87   Temp 98.6 F (37 C) (Oral)   Resp 18   Ht 5\' 7"  (1.702 m)   Wt (!) 140.6 kg   SpO2 99%   BMI 48.55 kg/m  Physical Exam Vitals and nursing note reviewed.  Constitutional:      General: She is not in acute distress.    Appearance: She is well-developed.  HENT:     Head: Normocephalic and atraumatic.     Ears:     Comments: Bilateral TMs are erythematous    Nose: Congestion present.     Mouth/Throat:     Mouth: Mucous membranes are moist.     Pharynx: No oropharyngeal exudate or posterior oropharyngeal erythema.  Eyes:     Extraocular Movements: Extraocular movements intact.     Conjunctiva/sclera: Conjunctivae normal.     Pupils: Pupils are equal, round, and reactive to light.  Cardiovascular:     Rate and Rhythm: Normal rate and regular rhythm.     Heart sounds: No murmur heard. Pulmonary:     Effort: Pulmonary effort is normal. No respiratory distress.     Breath sounds: Normal breath sounds.  Abdominal:     Palpations: Abdomen is soft.     Tenderness: There is no abdominal tenderness.  Musculoskeletal:        General: No swelling.     Cervical back:  Neck supple.  Skin:    General: Skin is warm and dry.     Capillary Refill: Capillary refill takes less than 2 seconds.  Neurological:     General: No focal deficit present.     Mental Status: She is alert and oriented to person, place, and time.     Cranial Nerves: No cranial nerve deficit.     Sensory: No sensory deficit.     Motor: No weakness.     Coordination: Coordination normal.     Comments: 5+ out of 5 strength throughout, normal sensation, no drift, normal finger-nose-finger, normal gait  Psychiatric:        Mood and Affect: Mood normal.     ED Results / Procedures / Treatments   Labs (all labs ordered are listed, but only abnormal results are displayed) Labs Reviewed   RESP PANEL BY RT-PCR (FLU A&B, COVID) ARPGX2    EKG None  Radiology No results found.  Procedures Procedures    Medications Ordered in ED Medications  ketorolac (TORADOL) injection 30 mg (has no administration in time range)  dexamethasone (DECADRON) tablet 10 mg (has no administration in time range)    ED Course/ Medical Decision Making/ A&P                           Medical Decision Making Risk Prescription drug management.   [facility] is here with body aches, congestion, viral symptoms, diarrhea.  Normal vitals.  No fever.  History of diabetes.  Symptoms since last night.  Diarrhea has improved but she still feels achy with sinus congestion, ear pain.  Both eardrums appear a little bit red.  She is got some nasal congestion.  Viral swab is negative.  But I suspect that she still likely has some other viral process.  Will give Decadron and will cover for may be sinusitis with Augmentin.  I have no concern for meningitis or other acut infectious process.  She is well-appearing.  We will have her follow-up with primary care doctor.  She understands return precautions.  Discharged in good condition.  This chart was dictated using voice recognition software.  Despite best efforts to proofread,  errors can occur which can change the documentation meaning.         Final Clinical Impression(s) / ED Diagnoses Final diagnoses:  Acute sinusitis, recurrence not specified, unspecified location  Influenza-like illness    Rx / DC Orders ED Discharge Orders          Ordered    amoxicillin-clavulanate (AUGMENTIN) 875-125 MG tablet  Every 12 hours        08/29/22 1437              Liesl Simons, DO 08/29/22 1440

## 2022-08-29 NOTE — ED Triage Notes (Signed)
Stated "everything hurts" started last night, no relief from Excedrin. Stated "I'm achy and don't feel like moving"

## 2022-11-10 ENCOUNTER — Emergency Department (HOSPITAL_BASED_OUTPATIENT_CLINIC_OR_DEPARTMENT_OTHER)
Admission: EM | Admit: 2022-11-10 | Discharge: 2022-11-10 | Disposition: A | Discharge status: Home / Self Care | Payer: Commercial Managed Care - HMO | Attending: Emergency Medicine | Admitting: Emergency Medicine

## 2022-11-10 ENCOUNTER — Encounter (HOSPITAL_BASED_OUTPATIENT_CLINIC_OR_DEPARTMENT_OTHER): Payer: Self-pay

## 2022-11-10 ENCOUNTER — Other Ambulatory Visit (HOSPITAL_BASED_OUTPATIENT_CLINIC_OR_DEPARTMENT_OTHER): Payer: Self-pay

## 2022-11-10 ENCOUNTER — Other Ambulatory Visit: Payer: Self-pay

## 2022-11-10 DIAGNOSIS — Z7984 Long term (current) use of oral hypoglycemic drugs: Secondary | ICD-10-CM | POA: Insufficient documentation

## 2022-11-10 DIAGNOSIS — R0981 Nasal congestion: Secondary | ICD-10-CM | POA: Diagnosis present

## 2022-11-10 DIAGNOSIS — E119 Type 2 diabetes mellitus without complications: Secondary | ICD-10-CM | POA: Diagnosis not present

## 2022-11-10 DIAGNOSIS — Z1152 Encounter for screening for COVID-19: Secondary | ICD-10-CM | POA: Diagnosis not present

## 2022-11-10 DIAGNOSIS — J0100 Acute maxillary sinusitis, unspecified: Secondary | ICD-10-CM | POA: Insufficient documentation

## 2022-11-10 LAB — RESP PANEL BY RT-PCR (RSV, FLU A&B, COVID)  RVPGX2
Influenza A by PCR: NEGATIVE
Influenza B by PCR: NEGATIVE
Resp Syncytial Virus by PCR: NEGATIVE
SARS Coronavirus 2 by RT PCR: NEGATIVE

## 2022-11-10 MED ORDER — AMOXICILLIN-POT CLAVULANATE 875-125 MG PO TABS
1.0000 | ORAL_TABLET | Freq: Two times a day (BID) | ORAL | 0 refills | Status: AC
  Filled 2022-11-10: qty 14, 7d supply, fill #0

## 2022-11-10 MED ORDER — DEXAMETHASONE 4 MG PO TABS
10.0000 mg | ORAL_TABLET | Freq: Once | ORAL | Status: AC
  Administered 2022-11-10: 10 mg via ORAL
  Filled 2022-11-10: qty 3

## 2022-11-10 NOTE — ED Provider Notes (Signed)
[facility] EMERGENCY DEPARTMENT Provider Note   CSN: [id number] Arrival date & time: 11/10/22  1218     History  Chief Complaint  Patient presents with   Nasal Congestion   Headache    Melissa Rollins is a 41 y.o. female.  Patient here with nasal congestion, ear pain.  Symptoms for the last few days.  Nothing makes it worse or better.  History of diabetes, high blood pressure.  Denies any fever chills or sputum production.  No chest pain or shortness of breath or weakness or numbness or  The history is provided by the patient.       Home Medications Prior to Admission medications   Medication Sig Start Date End Date Taking? Authorizing Provider  amoxicillin-clavulanate (AUGMENTIN) 875-125 MG tablet Take 1 tablet by mouth every 12 (twelve) hours for 7 days. 11/10/22 11/17/22 Yes Aryah Doering, DO  albuterol (PROVENTIL HFA;VENTOLIN HFA) 108 (90 Base) MCG/ACT inhaler Inhale into the lungs. 07/03/16   [provider]  levothyroxine (SYNTHROID, LEVOTHROID) 200 MCG tablet Take 200 mcg by mouth daily before breakfast.    [provider]  lisinopril (PRINIVIL,ZESTRIL) 10 MG tablet Take 10 mg by mouth daily.    [provider]  metFORMIN (GLUCOPHAGE) 500 MG tablet Take 1,000 mg by mouth 2 (two) times daily with a meal.     [provider]  morphine (MSIR) 15 MG tablet Take 0.5 tablets (7.5 mg total) by mouth every 4 (four) hours as needed for severe pain. 03/24/22   Melene Plan, DO  ondansetron (ZOFRAN-ODT) 4 MG disintegrating tablet 4mg  ODT q4 hours prn nausea/vomit 03/24/22   03/26/22, DO  rosuvastatin (CRESTOR) 40 MG tablet Take 20 mg by mouth daily.  06/27/19   [provider]      Allergies    Ciprofloxacin hcl, Doxycycline, Promethazine hcl, Bactrim [sulfamethoxazole-trimethoprim], Ibuprofen, Limonene, Cefdinir, and Iodinated contrast media    Review of Systems   Review of Systems  Physical Exam Updated Vital Signs BP (!)  161/90 (BP Location: Left Arm)   Pulse 85   Temp 97.9 F (36.6 C) (Oral)   Resp 18   Wt (!) 141.5 kg   SpO2 95%   BMI 48.87 kg/m  Physical Exam Vitals and nursing note reviewed.  Constitutional:      General: She is not in acute distress.    Appearance: She is well-developed.  HENT:     Head: Normocephalic and atraumatic.     Mouth/Throat:     Mouth: Mucous membranes are moist.  Eyes:     Extraocular Movements: Extraocular movements intact.     Conjunctiva/sclera: Conjunctivae normal.  Cardiovascular:     Rate and Rhythm: Normal rate and regular rhythm.     Heart sounds: Normal heart sounds. No murmur heard. Pulmonary:     Effort: Pulmonary effort is normal. No respiratory distress.     Breath sounds: Normal breath sounds.  Abdominal:     Palpations: Abdomen is soft.     Tenderness: There is no abdominal tenderness.  Musculoskeletal:        General: No swelling.     Cervical back: Normal range of motion and neck supple.  Skin:    General: Skin is warm and dry.     Capillary Refill: Capillary refill takes less than 2 seconds.  Neurological:     Mental Status: She is alert.  Psychiatric:        Mood and Affect: Mood normal.     ED  Results / Procedures / Treatments   Labs (all labs ordered are listed, but only abnormal results are displayed) Labs Reviewed  RESP PANEL BY RT-PCR (RSV, FLU A&B, COVID)  [id number]    EKG None  Radiology No results found.  Procedures Procedures    Medications Ordered in ED Medications  dexamethasone (DECADRON) tablet 10 mg (has no administration in time range)    ED Course/ Medical Decision Making/ A&P                           Medical Decision Making Risk Prescription drug management.   [facility] is here with nasal congestion, ear pain, viral symptoms.  Normal vitals.  No fever.  Normal exam.  COVID and flu test negative.  Differential likely acute sinusitis.  Could be viral process.  Given negative viral panel will  treat with antibiotics.  Recommend Tylenol ibuprofen.  Overall appears well.  No concern for other acute process.  Discharged in good condition.  This chart was dictated using voice recognition software.  Despite best efforts to proofread,  errors can occur which can change the documentation meaning.         Final Clinical Impression(s) / ED Diagnoses Final diagnoses:  Acute non-recurrent maxillary sinusitis    Rx / DC Orders ED Discharge Orders          Ordered    amoxicillin-clavulanate (AUGMENTIN) 875-125 MG tablet  Every 12 hours        11/10/22 1321              Adger Cantera, DO 11/10/22 1321

## 2022-11-10 NOTE — ED Notes (Signed)
ED Provider at bedside. 

## 2022-11-10 NOTE — ED Triage Notes (Signed)
Pt reports HA, congestion,ear pain diarrhea and nausea x 3days

## 2024-02-07 ENCOUNTER — Encounter (HOSPITAL_BASED_OUTPATIENT_CLINIC_OR_DEPARTMENT_OTHER): Payer: Self-pay | Admitting: Emergency Medicine

## 2024-02-07 ENCOUNTER — Other Ambulatory Visit: Payer: Self-pay

## 2024-02-07 ENCOUNTER — Emergency Department (HOSPITAL_BASED_OUTPATIENT_CLINIC_OR_DEPARTMENT_OTHER): Payer: Worker's Compensation

## 2024-02-07 ENCOUNTER — Emergency Department (HOSPITAL_BASED_OUTPATIENT_CLINIC_OR_DEPARTMENT_OTHER)
Admission: EM | Admit: 2024-02-07 | Discharge: 2024-02-07 | Disposition: A | Discharge status: Home / Self Care | Payer: Worker's Compensation | Attending: Emergency Medicine | Admitting: Emergency Medicine

## 2024-02-07 ENCOUNTER — Other Ambulatory Visit (HOSPITAL_BASED_OUTPATIENT_CLINIC_OR_DEPARTMENT_OTHER): Payer: Self-pay

## 2024-02-07 DIAGNOSIS — Y990 Civilian activity done for income or pay: Secondary | ICD-10-CM | POA: Diagnosis not present

## 2024-02-07 DIAGNOSIS — M542 Cervicalgia: Secondary | ICD-10-CM | POA: Diagnosis present

## 2024-02-07 DIAGNOSIS — Z79899 Other long term (current) drug therapy: Secondary | ICD-10-CM | POA: Diagnosis not present

## 2024-02-07 DIAGNOSIS — E039 Hypothyroidism, unspecified: Secondary | ICD-10-CM | POA: Diagnosis not present

## 2024-02-07 DIAGNOSIS — E119 Type 2 diabetes mellitus without complications: Secondary | ICD-10-CM | POA: Insufficient documentation

## 2024-02-07 DIAGNOSIS — Z7984 Long term (current) use of oral hypoglycemic drugs: Secondary | ICD-10-CM | POA: Insufficient documentation

## 2024-02-07 DIAGNOSIS — S0990XA Unspecified injury of head, initial encounter: Secondary | ICD-10-CM | POA: Insufficient documentation

## 2024-02-07 DIAGNOSIS — S161XXA Strain of muscle, fascia and tendon at neck level, initial encounter: Secondary | ICD-10-CM | POA: Diagnosis not present

## 2024-02-07 DIAGNOSIS — I10 Essential (primary) hypertension: Secondary | ICD-10-CM | POA: Insufficient documentation

## 2024-02-07 DIAGNOSIS — R0781 Pleurodynia: Secondary | ICD-10-CM | POA: Diagnosis not present

## 2024-02-07 DIAGNOSIS — W208XXA Other cause of strike by thrown, projected or falling object, initial encounter: Secondary | ICD-10-CM | POA: Insufficient documentation

## 2024-02-07 MED ORDER — HYDROCODONE-ACETAMINOPHEN 5-325 MG PO TABS
1.0000 | ORAL_TABLET | Freq: Four times a day (QID) | ORAL | 0 refills | Status: AC | PRN
  Filled 2024-02-07: qty 10, 3d supply, fill #0

## 2024-02-07 MED ORDER — LIDOCAINE 5 % EX PTCH
1.0000 | MEDICATED_PATCH | Freq: Once | CUTANEOUS | Status: DC
  Administered 2024-02-07: 1 via TRANSDERMAL
  Filled 2024-02-07: qty 1

## 2024-02-07 MED ORDER — CYCLOBENZAPRINE HCL 10 MG PO TABS
10.0000 mg | ORAL_TABLET | Freq: Once | ORAL | Status: AC
  Administered 2024-02-07: 10 mg via ORAL
  Filled 2024-02-07: qty 1

## 2024-02-07 NOTE — ED Provider Notes (Signed)
 Patient CT head and neck without any acute findings.  Patient still seems to imply that she is got some tingling in both hands.  Mostly along the palms and a little bit in the left leg.  Did offer MRI to rule out central cord syndrome.  Patient Apsley refuses that says she will do that should do as an outpatient because she had a very long day injury occurred last night she has been at work she has been in urgent care took a long time for her CT results to come back.  Will go ahead treat her with some hydrocodone close follow-up with her primary care doctor and a work note.   Vanetta Mulders, MD 02/07/24 1744

## 2024-02-07 NOTE — Discharge Instructions (Signed)
 Central cord injury not completely ruled out if any of the tingling and numbness symptoms persist will need MRI cervical spine.  Take the pain medication as directed follow-up with your primary care doctor.  Work note provided.  CT head and neck without any acute findings.

## 2024-02-07 NOTE — ED Provider Notes (Signed)
 [place] [facility] AT [facility] Provider Note   CSN: [id number] Arrival date & time: 02/07/24  1250     History  Chief Complaint  Patient presents with   Head Injury    Melissa Rollins is a 44 y.o. female.  Patient is a 43 year old female with past medical history of hypertension, diabetes and hypothyroidism presenting to the emergency department for a head injury.  Patient states that last night at work an approximately 30 pound window fell on her head.  She denied any loss of consciousness.  She denies any nausea or vomiting.  She states that she is having associated left-sided neck, left rib and left low back pain.  She states that her back pain is radiating down her left leg.  She states that she is having paresthesias in the palms of her bilateral hands.  She denies any weakness or vision changes.  She denies any blood thinner use.  She was seen at urgent care today and is recommended to come to the ED for further evaluation.  She had thoracic and lumbar spine x-rays done at urgent care that showed no acute traumatic injury.  The history is provided by the patient.  Head Injury      Home Medications Prior to Admission medications   Medication Sig Start Date End Date Taking? Authorizing Provider  albuterol (PROVENTIL HFA;VENTOLIN HFA) 108 (90 Base) MCG/ACT inhaler Inhale into the lungs. 07/03/16   [provider]  levothyroxine (SYNTHROID, LEVOTHROID) 200 MCG tablet Take 200 mcg by mouth daily before breakfast.    [provider]  lisinopril (PRINIVIL,ZESTRIL) 10 MG tablet Take 10 mg by mouth daily.    [provider]  metFORMIN (GLUCOPHAGE) 500 MG tablet Take 1,000 mg by mouth 2 (two) times daily with a meal.     [provider]  morphine (MSIR) 15 MG tablet Take 0.5 tablets (7.5 mg total) by mouth every 4 (four) hours as needed for severe pain. 03/24/22   Melene Plan, DO  ondansetron (ZOFRAN-ODT) 4 MG disintegrating tablet  4mg  ODT q4 hours prn nausea/vomit 03/24/22   Melene Plan, DO  rosuvastatin (CRESTOR) 40 MG tablet Take 20 mg by mouth daily.  06/27/19   [provider]      Allergies    Ciprofloxacin hcl, Doxycycline, Promethazine hcl, Bactrim [sulfamethoxazole-trimethoprim], Ibuprofen, Limonene, Cefdinir, and Iodinated contrast media    Review of Systems   Review of Systems  Physical Exam Updated Vital Signs BP (!) 150/79 (BP Location: Left Arm)   Pulse 80   Temp 97.6 F (36.4 C)   Resp 17   Ht 5\' 7"  (1.702 m)   Wt 135.6 kg   SpO2 96%   BMI 46.83 kg/m  Physical Exam Vitals and nursing note reviewed.  Constitutional:      General: She is not in acute distress.    Appearance: Normal appearance. She is obese.  HENT:     Head: Normocephalic and atraumatic.     Nose: Nose normal.     Mouth/Throat:     Mouth: Mucous membranes are moist.     Pharynx: Oropharynx is clear.  Eyes:     Extraocular Movements: Extraocular movements intact.     Conjunctiva/sclera: Conjunctivae normal.     Pupils: Pupils are equal, round, and reactive to light.  Neck:     Comments: L-sided cervical paraspinal muscle tenderness to palpation Cardiovascular:     Rate and Rhythm: Normal rate and regular rhythm.     Pulses:  Normal pulses.     Heart sounds: Normal heart sounds.  Pulmonary:     Effort: Pulmonary effort is normal.     Breath sounds: Normal breath sounds.  Abdominal:     General: Abdomen is flat.     Palpations: Abdomen is soft.     Tenderness: There is no abdominal tenderness.  Musculoskeletal:        General: Normal range of motion.     Cervical back: Normal range of motion and neck supple.     Comments: L-sided posterior rib tenderness to palpation,  L lower thoracic/proximal lumbar paraspinal muscle tenderness, no midline back tenderness No bony tenderness to bilateral UE or LE Positive straight leg raise on the L  Skin:    General: Skin is warm and dry.  Neurological:     General: No  focal deficit present.     Mental Status: She is alert and oriented to person, place, and time.  Psychiatric:        Mood and Affect: Mood normal.        Behavior: Behavior normal.     ED Results / Procedures / Treatments   Labs (all labs ordered are listed, but only abnormal results are displayed) Labs Reviewed - No data to display  EKG None  Radiology No results found.  Procedures Procedures    Medications Ordered in ED Medications  lidocaine (LIDODERM) 5 % 1-3 patch (1 patch Transdermal Patch Applied 02/07/24 1346)  cyclobenzaprine (FLEXERIL) tablet 10 mg (10 mg Oral Given 02/07/24 1345)    ED Course/ Medical Decision Making/ A&P Clinical Course as of 02/07/24 1513  Thu Feb 07, 2024  1512 Patient signed out to Dr. Deretha Emory pending XR and CT read in stable condition.  [VK]    Clinical Course User Index [VK] Rexford Maus, DO                                 Medical Decision Making This patient presents to the ED with chief complaint(s) of head injury with pertinent past medical history of HTN, DM, hypothyroidism which further complicates the presenting complaint. The complaint involves an extensive differential diagnosis and also carries with it a high risk of complications and morbidity.    The differential diagnosis includes concussion, muscle strain or spasm, ICH, mass effect, cervical spine injury, rib injury, sciatica  Additional history obtained: Additional history obtained from N/A Records reviewed Care Everywhere/External Records  ED Course and Reassessment: On patient's arrival she is hemodynamically stable in no acute distress.  With patient's subjective paresthesias in the setting of her injury will have CT head and C-spine performed in addition to left-sided rib series.  Had thoracic and lumbar x-rays done at urgent care that were normal.  Patient does have subjective paresthesias in her hands but has no focal neurologic deficits on my exam making a  lower concern for central cord or other spinal cord injury but will reassess after CT imaging.  Will be given Flexeril and lidocaine patch for symptomatic management and will be closely reassessed.  Independent labs interpretation:  N/A  Independent visualization of imaging: - Pending     Amount and/or Complexity of Data Reviewed Radiology: ordered.  Risk Prescription drug management.          Final Clinical Impression(s) / ED Diagnoses Final diagnoses:  Injury of head, initial encounter  Strain of neck muscle, initial encounter    Rx /  DC Orders ED Discharge Orders     None         Rexford Maus, [place] 02/07/24 1513

## 2024-02-07 NOTE — ED Triage Notes (Signed)
 Pt POV steady gait- reports window (appx 30 lb) fell on top of head appx 2130 yesterday.  Denies LOC, denies blood thinners.    C/o headache p taking BC powder today 0730 today.  Denies n/v.   C/o BL hand tingling, L worse than right.  AOx4.

## 2024-03-18 ENCOUNTER — Other Ambulatory Visit (HOSPITAL_BASED_OUTPATIENT_CLINIC_OR_DEPARTMENT_OTHER): Payer: Self-pay

## 2024-03-31 ENCOUNTER — Other Ambulatory Visit (HOSPITAL_BASED_OUTPATIENT_CLINIC_OR_DEPARTMENT_OTHER): Payer: Self-pay

## 2024-04-01 ENCOUNTER — Other Ambulatory Visit (HOSPITAL_BASED_OUTPATIENT_CLINIC_OR_DEPARTMENT_OTHER): Payer: Self-pay

## 2024-04-04 ENCOUNTER — Other Ambulatory Visit (HOSPITAL_BASED_OUTPATIENT_CLINIC_OR_DEPARTMENT_OTHER): Payer: Self-pay

## 2024-04-08 ENCOUNTER — Other Ambulatory Visit (HOSPITAL_BASED_OUTPATIENT_CLINIC_OR_DEPARTMENT_OTHER): Payer: Self-pay

## 2024-04-09 ENCOUNTER — Other Ambulatory Visit (HOSPITAL_BASED_OUTPATIENT_CLINIC_OR_DEPARTMENT_OTHER): Payer: Self-pay

## 2024-04-10 ENCOUNTER — Other Ambulatory Visit (HOSPITAL_BASED_OUTPATIENT_CLINIC_OR_DEPARTMENT_OTHER): Payer: Self-pay

## 2024-04-16 ENCOUNTER — Other Ambulatory Visit (HOSPITAL_BASED_OUTPATIENT_CLINIC_OR_DEPARTMENT_OTHER): Payer: Self-pay

## 2024-04-17 ENCOUNTER — Other Ambulatory Visit (HOSPITAL_BASED_OUTPATIENT_CLINIC_OR_DEPARTMENT_OTHER): Payer: Self-pay

## 2024-04-18 ENCOUNTER — Other Ambulatory Visit (HOSPITAL_BASED_OUTPATIENT_CLINIC_OR_DEPARTMENT_OTHER): Payer: Self-pay

## 2024-04-18 ENCOUNTER — Encounter (HOSPITAL_BASED_OUTPATIENT_CLINIC_OR_DEPARTMENT_OTHER): Payer: Self-pay

## 2024-10-08 ENCOUNTER — Emergency Department (HOSPITAL_BASED_OUTPATIENT_CLINIC_OR_DEPARTMENT_OTHER)

## 2024-10-08 ENCOUNTER — Other Ambulatory Visit: Payer: Self-pay

## 2024-10-08 ENCOUNTER — Encounter (HOSPITAL_BASED_OUTPATIENT_CLINIC_OR_DEPARTMENT_OTHER): Payer: Self-pay | Admitting: Emergency Medicine

## 2024-10-08 ENCOUNTER — Emergency Department (HOSPITAL_BASED_OUTPATIENT_CLINIC_OR_DEPARTMENT_OTHER): Admission: EM | Admit: 2024-10-08 | Discharge: 2024-10-09 | Disposition: A | Discharge status: Home / Self Care

## 2024-10-08 DIAGNOSIS — I10 Essential (primary) hypertension: Secondary | ICD-10-CM | POA: Diagnosis not present

## 2024-10-08 DIAGNOSIS — R10A0 Flank pain, unspecified side: Secondary | ICD-10-CM | POA: Diagnosis present

## 2024-10-08 DIAGNOSIS — E119 Type 2 diabetes mellitus without complications: Secondary | ICD-10-CM | POA: Diagnosis not present

## 2024-10-08 DIAGNOSIS — N12 Tubulo-interstitial nephritis, not specified as acute or chronic: Secondary | ICD-10-CM | POA: Diagnosis not present

## 2024-10-08 DIAGNOSIS — Z7984 Long term (current) use of oral hypoglycemic drugs: Secondary | ICD-10-CM | POA: Diagnosis not present

## 2024-10-08 DIAGNOSIS — Z79899 Other long term (current) drug therapy: Secondary | ICD-10-CM | POA: Diagnosis not present

## 2024-10-08 LAB — URINALYSIS, ROUTINE W REFLEX MICROSCOPIC
Bilirubin Urine: NEGATIVE
Glucose, UA: NEGATIVE mg/dL
Ketones, ur: NEGATIVE mg/dL
Nitrite: NEGATIVE
Protein, ur: >=300 mg/dL — AB
Specific Gravity, Urine: 1.025 (ref 1.005–1.030)
pH: 7 (ref 5.0–8.0)

## 2024-10-08 LAB — CBC
HCT: 43.5 % (ref 36.0–46.0)
Hemoglobin: 14.5 g/dL (ref 12.0–15.0)
MCH: 29 pg (ref 26.0–34.0)
MCHC: 33.3 g/dL (ref 30.0–36.0)
MCV: 87 fL (ref 80.0–100.0)
Platelets: 284 K/uL (ref 150–400)
RBC: 5 MIL/uL (ref 3.87–5.11)
RDW: 15.5 % (ref 11.5–15.5)
WBC: 19.1 K/uL — ABNORMAL HIGH (ref 4.0–10.5)
nRBC: 0 % (ref 0.0–0.2)

## 2024-10-08 LAB — BASIC METABOLIC PANEL WITH GFR
Anion gap: 17 — ABNORMAL HIGH (ref 5–15)
BUN: 11 mg/dL (ref 6–20)
CO2: 23 mmol/L (ref 22–32)
Calcium: 10.3 mg/dL (ref 8.9–10.3)
Chloride: 97 mmol/L — ABNORMAL LOW (ref 98–111)
Creatinine, Ser: 0.66 mg/dL (ref 0.44–1.00)
GFR, Estimated: >60 mL/min (ref >60)
Glucose, Bld: 154 mg/dL — ABNORMAL HIGH (ref 70–99)
Potassium: 3.9 mmol/L (ref 3.5–5.1)
Sodium: 137 mmol/L (ref 135–145)

## 2024-10-08 LAB — URINALYSIS, MICROSCOPIC (REFLEX): RBC / HPF: >50 RBC/hpf (ref 0–5)

## 2024-10-08 LAB — PREGNANCY, URINE: Preg Test, Ur: NEGATIVE

## 2024-10-08 MED ORDER — ONDANSETRON 4 MG PO TBDP: 4.0000 mg | ORAL_TABLET | Freq: Three times a day (TID) | ORAL | 0 refills | Status: DC | PRN

## 2024-10-08 MED ORDER — CEPHALEXIN 500 MG PO CAPS: 500.0000 mg | ORAL_CAPSULE | Freq: Four times a day (QID) | ORAL | 0 refills | Status: AC

## 2024-10-08 MED ORDER — ONDANSETRON HCL 4 MG/2ML IJ SOLN
4.0000 mg | Freq: Once | INTRAMUSCULAR | Status: AC
  Administered 2024-10-08: 4 mg via INTRAVENOUS
  Filled 2024-10-08: qty 2

## 2024-10-08 MED ORDER — OXYCODONE-ACETAMINOPHEN 5-325 MG PO TABS: 1.0000 | ORAL_TABLET | Freq: Four times a day (QID) | ORAL | 0 refills | Status: AC | PRN

## 2024-10-08 MED ORDER — SODIUM CHLORIDE 0.9 % IV SOLN
1.0000 g | Freq: Once | INTRAVENOUS | Status: AC
  Administered 2024-10-08: 1 g via INTRAVENOUS
  Filled 2024-10-08: qty 10

## 2024-10-08 MED ORDER — KETOROLAC TROMETHAMINE 15 MG/ML IJ SOLN
15.0000 mg | Freq: Once | INTRAMUSCULAR | Status: AC
  Administered 2024-10-08: 15 mg via INTRAVENOUS
  Filled 2024-10-08: qty 1

## 2024-10-08 NOTE — ED Triage Notes (Signed)
 Pt reports seeing her PCP today and dx with UTI. Per pt she was given fosfomycin (taken at 1600) but states she can't handle the abd and back pain she is having.

## 2024-10-08 NOTE — Discharge Instructions (Addendum)
 Take the antibiotics prescribed to you for the full course even if symptoms improve.  Return for fevers, chills, severe pain, uncontrolled nausea vomiting, you stop urinating, or develop any new or worsening symptoms that are concerning to you.

## 2024-10-08 NOTE — ED Provider Notes (Signed)
 [place] [facility] AT [facility] Provider Note   CSN: [id number] Arrival date & time: 10/08/24  2003     Patient presents with: Follow-up   Melissa Rollins is a 43 y.o. female.   This is a 43 year old female presenting emergency department with flank pain, nausea but no vomiting.  Has had dysuria and frequency for the past few days.  Went to urgent care was given a dose of fosfomycin.  Reports had some intermittent flank pain earlier today, but now constant.  Notes blood in the urine.  No fevers or chills.        Prior to Admission medications   Medication Sig Start Date End Date Taking? Authorizing Provider  albuterol  (PROVENTIL  HFA;VENTOLIN  HFA) 108 (90 Base) MCG/ACT inhaler Inhale into the lungs. 07/03/16   [provider]  HYDROcodone -acetaminophen  (NORCO/VICODIN) 5-325 MG tablet Take 1 tablet by mouth every 6 (six) hours as needed for moderate pain (pain score 4-6). 02/07/24   Zackowski, Scott, MD  levothyroxine  (SYNTHROID , LEVOTHROID) 200 MCG tablet Take 200 mcg by mouth daily before breakfast.    [provider]  lisinopril (PRINIVIL,ZESTRIL) 10 MG tablet Take 10 mg by mouth daily.    [provider]  metFORMIN (GLUCOPHAGE) 500 MG tablet Take 1,000 mg by mouth 2 (two) times daily with a meal.     [provider]  morphine  (MSIR) 15 MG tablet Take 0.5 tablets (7.5 mg total) by mouth every 4 (four) hours as needed for severe pain. 03/24/22   Emil Share, DO  ondansetron  (ZOFRAN -ODT) 4 MG disintegrating tablet 4mg  ODT q4 hours prn nausea/vomit 03/24/22   Floyd, Dan, DO  rosuvastatin (CRESTOR) 40 MG tablet Take 20 mg by mouth daily.  06/27/19   [provider]    Allergies: Ciprofloxacin  hcl, Doxycycline , Promethazine hcl, Bactrim  [sulfamethoxazole -trimethoprim ], Ibuprofen, Limonene, Cefdinir, and Iodinated contrast media    Review of Systems  Updated Vital Signs BP (!) 158/90 (BP Location: Right Arm)   Pulse 90    Temp 98.5 F (36.9 C)   Resp 20   Ht 5' 7 (1.702 m)   Wt 132 kg   SpO2 91%   BMI 45.58 kg/m   Physical Exam Vitals and nursing note reviewed.  Constitutional:      General: She is not in acute distress.    Appearance: She is obese. She is not toxic-appearing.  HENT:     Head: Normocephalic.     Nose: Nose normal.     Mouth/Throat:     Mouth: Mucous membranes are moist.  Eyes:     Conjunctiva/sclera: Conjunctivae normal.  Cardiovascular:     Rate and Rhythm: Normal rate and regular rhythm.  Pulmonary:     Effort: Pulmonary effort is normal.  Abdominal:     General: Abdomen is flat. There is no distension.     Tenderness: There is no abdominal tenderness. There is left CVA tenderness. There is no guarding or rebound.  Musculoskeletal:     Right lower leg: No edema.     Left lower leg: No edema.  Skin:    General: Skin is warm.     Capillary Refill: Capillary refill takes less than 2 seconds.  Neurological:     Mental Status: She is alert and oriented to person, place, and time.  Psychiatric:        Mood and Affect: Mood normal.        Behavior: Behavior normal.     (all labs ordered are listed, but  only abnormal results are displayed) Labs Reviewed  CBC - Abnormal; Notable for the following components:      Result Value   WBC 19.1 (*)    All other components within normal limits  BASIC METABOLIC PANEL WITH GFR - Abnormal; Notable for the following components:   Chloride 97 (*)    Glucose, Bld 154 (*)    Anion gap 17 (*)    All other components within normal limits  URINALYSIS, ROUTINE W REFLEX MICROSCOPIC - Abnormal; Notable for the following components:   Color, Urine BROWN (*)    APPearance TURBID (*)    Hgb urine dipstick LARGE (*)    Protein, ur >=300 (*)    Leukocytes,Ua SMALL (*)    All other components within normal limits  URINALYSIS, MICROSCOPIC (REFLEX) - Abnormal; Notable for the following components:   Bacteria, UA FEW (*)    All other  components within normal limits  URINE CULTURE  PREGNANCY, URINE    EKG: None  Radiology: CT Renal Stone Study Result Date: 10/08/2024 EXAM: CT ABDOMEN AND PELVIS WITHOUT CONTRAST 10/08/2024 10:13:52 PM TECHNIQUE: CT of the abdomen and pelvis was performed without the administration of intravenous contrast. Multiplanar reformatted images are provided for review. Automated exposure control, iterative reconstruction, and/or weight-based adjustment of the mA/kV was utilized to reduce the radiation dose to as low as reasonably achievable. COMPARISON: 03/24/2022 CLINICAL HISTORY: Abdominal/flank pain, stone suspected. FINDINGS: LOWER CHEST: No acute abnormality. LIVER: Mildly nodular hepatic contour, correlate for cirrhosis. GALLBLADDER AND BILE DUCTS: Status post cholecystectomy. No biliary ductal dilatation. SPLEEN: No acute abnormality. PANCREAS: No acute abnormality. ADRENAL GLANDS: No acute abnormality. KIDNEYS, URETERS AND BLADDER: Mild fullness of the left renal collecting system with periureteral stranding, correlate for ascending infection. No stones in the kidneys or ureters. No perinephric stranding. Thick-walled bladder with perivesical stranding, suggesting cystitis. GI AND BOWEL: Stomach demonstrates no acute abnormality. There is no bowel obstruction. Moderate fat-containing periumbilical hernia with mild stranding. PERITONEUM AND RETROPERITONEUM: No ascites. No free air. VASCULATURE: Aorta is normal in caliber. Atherosclerotic calcifications of the abdominal aorta and branch vessels. LYMPH NODES: No lymphadenopathy. REPRODUCTIVE ORGANS: Status post hysterectomy. BONES AND SOFT TISSUES: No acute osseous abnormality. No focal soft tissue abnormality. IMPRESSION: 1. Cystitis. 2. Mild fullness of the left renal collecting system with periureteral stranding, suspicious for ascending urinary tract infection. 3. Moderate fat-containing periumbilical hernia. Electronically signed by: Pinkie Pebbles  MD 10/08/2024 10:18 PM EST RP Workstation: [id number]     Procedures   Medications Ordered in the ED  cefTRIAXone  (ROCEPHIN ) 1 g in sodium chloride  0.9 % 100 mL IVPB (has no administration in time range)  ketorolac  (TORADOL ) 15 MG/ML injection 15 mg (15 mg Intravenous Given 10/08/24 2147)  ondansetron  (ZOFRAN ) injection 4 mg (4 mg Intravenous Given 10/08/24 2145)    Clinical Course as of 10/08/24 2305  Wed Oct 08, 2024  2222 CT Renal Stone Study IMPRESSION: 1. Cystitis. 2. Mild fullness of the left renal collecting system with periureteral stranding, suspicious for ascending urinary tract infection. 3. Moderate fat-containing periumbilical hernia.   [TY]  2300 Per chart review has had Rocephin  in in the past.  Patient's workup concerning for pyelonephritis.  Does have a leukocytosis, but no fever or tachycardia.  Received fosfomycin from urgent care.  Will give a dose of Rocephin  here.  Offered admission to patient, but would like to go home.  Shared decision making regarding the risks of doing so.  Will give Keflex  to go home with.  Given strict return precautions. [TY]    Clinical Course User Index [TY] Neysa Caron PARAS, DO                                 Medical Decision Making This is a 43 year old female presenting emergency department with dysuria frequency.  Diagnosed with UTI at urgent care earlier was given fosfomycin.  She is afebrile nontachycardic, maintaining ox saturation on room air.  Does not appear to be in overt distress or toxic.  Benign abdominal exam, some minor tenderness to her left flank.  Concern for possible stone given her symptoms.  Labs with a leukocytosis, but no fever or tachycardia to suggest sepsis.  She has normal renal function.  Electrolytes normal.  UA consistent with UTI.  CT scan without stone, but does have stranding consistent with pyelonephritis.  Given dose of Rocephin  here.  Will discharge at patient's request.  Urine culture sent.  Given  strict return precautions.  Amount and/or Complexity of Data Reviewed External Data Reviewed:     Details: History of diabetes, obesity, hypertension, hyperlipidemia Labs: ordered. Decision-making details documented in ED Course. Radiology: ordered and independent interpretation performed. Decision-making details documented in ED Course.    Details: No free air  Risk Prescription drug management. Decision regarding hospitalization. Diagnosis or treatment significantly limited by social determinants of health.       Final diagnoses:  None    ED Discharge Orders     None          Neysa Caron PARAS, DO 10/08/24 2305

## 2024-10-10 LAB — URINE CULTURE: Culture: <10000 — AB

## 2024-10-14 NOTE — ED Provider Notes (Signed)
 Patient placed in First Look pathway, seen and evaluated for chief complaint of low back pain, rx peylo, took antibiotics but reports ongoing pain..  Based on initial evaluation, labs are currently indicated and radiology studies are not currently indicated as allowed for current processes and treatments as applicable in a triage setting and could be different than if patient were seen in a main treatment area or dependent on labs/imagining after results are displayed.  Patient counseled on process, plan, and necessity for staying for completing the evaluation.   This document serves as a record of services personally performed by Fonda Sheffield PA-C.  The creation of this record is the providers dictation and/or activities during the visit.    Note By: Fonda Sheffield, PA-C 3:07 PM    [facility] Emergency Department Emergency Department Provider Note  Provider at Bedside:  10/14/2024 7:34 PM  Chief Complaint: Abdominal pressure, back pain  History of Present Illness:  History obtained from: Patient  Melissa Rollins is a 43 y.o. female with PMHx of HTN, HLD, DM2, thyroid disease, diverticulitis, GAD, and MDD who presents to the ED with complaints of abdominal pressure, low back pain, dysuria, and decreased urination, onset ~6 days ago. Patient reports she was initially prescribed Keflex  for a UTI on 10/08/2024 which she has been taking without relief to symptoms. She was then seen by PCP yesterday who administered 1g Rocephin  IM and advised the patient to go to the ED if there was no improvement in symptoms due to long list of allergies and urine culture testing positive for E.coli. Patient endorses history of UTI in 2020 that led to sepsis. Patient denies nausea, vomiting, diarrhea, hematuria, or urinary urgency. Patient denies any other medical complaints at this time.  ______________________ ROS: Pertinent positives and negatives per HPI. Pertinent past medical,  surgical, social and family history records were reviewed. Current Medications and Allergies were reviewed.  Physical Exam   Vitals:   10/14/24 1459 10/14/24 1958  BP: 138/79 150/71  BP Location: Right arm Right arm  Patient Position: Sitting Sitting  Pulse: 96 71  Resp: 18 16  Temp: 97.8 F (36.6 C) 98 F (36.7 C)  TempSrc: Oral Oral  SpO2: 94% 94%  Weight: 134 kg (296 lb)   Height: 167.6 cm (5' 6)       Physical Exam Vitals and nursing note reviewed.  Constitutional:      General: She is not in acute distress.    Appearance: Normal appearance. She is morbidly obese.  HENT:     Head: Normocephalic and atraumatic.     Right Ear: External ear normal.     Left Ear: External ear normal.     Nose: Nose normal.     Mouth/Throat:     Mouth: Mucous membranes are moist.  Eyes:     Pupils: Pupils are equal, round, and reactive to light.  Cardiovascular:     Rate and Rhythm: Normal rate and regular rhythm.     Heart sounds: Normal heart sounds.  Pulmonary:     Effort: Pulmonary effort is normal.     Breath sounds: Normal breath sounds.  Abdominal:     Palpations: Abdomen is soft.     Tenderness: There is no abdominal tenderness. There is no right CVA tenderness or left CVA tenderness.     Hernia: A hernia is present. Hernia is present in the ventral area.  Musculoskeletal:        General: Normal range of motion.  Cervical back: Normal range of motion.  Skin:    General: Skin is warm.  Neurological:     General: No focal deficit present.     Mental Status: She is alert and oriented to person, place, and time.  Psychiatric:        Mood and Affect: Mood normal.        Behavior: Behavior normal.        Thought Content: Thought content normal.     Results   EKG Impression:  N/A, none performed.  Labs: Lab Results (last 24 hours)     Procedure Component Value Ref Range Date/Time   Urinalysis with Reflex to Microscopic [[id number]]  (Abnormal) Collected:  10/14/24 1548   Lab Status: Final result Specimen: Urine from Clean Catch Updated: 10/14/24 1629    Color, Urine Yellow Yellow     Clarity, Urine Cloudy* Clear     Specific Gravity, Urine 1.027* 1.005 - 1.025     pH, Urine 5.5 5.0 - 8.0     Protein, Urine 70* Negative, 10 , 20  mg/dL     Glucose, Urine 499* Negative, 30 , 50  mg/dL     Ketones, Urine Negative Negative, Trace mg/dL     Bilirubin, Urine Negative Negative     Blood, Urine Negative Negative, Trace     Nitrite, Urine Negative Negative     Leukocyte Esterase, Urine Negative Negative, 25      Urobilinogen, Urine 2.0* <2.0 mg/dL     WBC, Urine 0-5 <6 /HPF     RBC, Urine 3-5* 0 - 2 /HPF     Bacteria, Urine Rare None Seen, Rare /HPF     Squamous Epithelial Cells, Urine 11-20* 0 - 5 /HPF     Hyaline Casts, Urine 0-2 0 - 2 /LPF    Urine Culture [[id number]] Collected: 10/14/24 1548   Lab Status: In process Specimen: Urine from Clean Catch Updated: 10/14/24 1615   Comprehensive Metabolic Panel [[id number]]  (Abnormal) Collected: 10/14/24 1517   Lab Status: Final result Specimen: Blood from Venous Updated: 10/14/24 1552    Sodium 136 136 - 145 mmol/L     Potassium 3.8 3.4 - 4.5 mmol/L     Chloride 104 98 - 107 mmol/L     CO2 23 21 - 31 mmol/L     Comment: High lactate dehydrogenase (LDH) concentrations in patient samples may cause falsely increased bicarbonate results. If markedly elevated LDH levels are suspected, please assess results in conjunction with patient's LDH values.      Anion Gap 9 6 - 14 mmol/L     Glucose, Random 271* 70 - 99 mg/dL     Blood Urea Nitrogen (BUN) 8 7 - 25 mg/dL     Creatinine 9.42* 9.39 - 1.20 mg/dL     eGFR >09 >40 fO/fpw/8.26f7     Comment: GFR estimated by CKD-EPI equations(NKF 2021).   Recommend confirmation of Cr-based eGFR by using Cys-based eGFR and other filtration markers (if applicable) in complex cases and clinical decision-making, as needed.      Albumin 4.2 3.5 - 5.7 g/dL      Total Protein 7.4 6.4 - 8.9 g/dL     Bilirubin, Total 0.4 0.3 - 1.0 mg/dL     Alkaline Phosphatase (ALP) 139* 34 - 104 U/L     Aspartate Aminotransferase (AST) 37 13 - 39 U/L     Alanine Aminotransferase (ALT) 21 7 - 52 U/L     Calcium 9.2 8.6 - 10.3 mg/dL  BUN/Creatinine Ratio 14.0 10.0 - 20.0    CBC with Differential [[id number]]  (Abnormal) Collected: 10/14/24 1517   Lab Status: Final result Specimen: Blood from Venous Updated: 10/14/24 1533   Narrative:     The following orders were created for panel order CBC with Differential. Procedure                               Abnormality         Status                    ---------                               -----------         ------                    CBC with Differential[[id number]]       Abnormal            Final result               Please view results for these tests on the individual orders.   CBC with Differential [[id number]]  (Abnormal) Collected: 10/14/24 1517   Lab Status: Final result Specimen: Blood from Venous Updated: 10/14/24 1533    WBC 12.55* 4.40 - 11.00 10*3/uL     RBC 4.71 4.10 - 5.10 10*6/uL     Hemoglobin 13.8 12.3 - 15.3 g/dL     Hematocrit 59.6 64.0 - 44.6 %     Mean Corpuscular Volume (MCV) 85.6 80.0 - 96.0 fL     Mean Corpuscular Hemoglobin (MCH) 29.4 27.5 - 33.2 pg     Mean Corpuscular Hemoglobin Conc (MCHC) 34.3 33.0 - 37.0 g/dL     Red Cell Distribution Width (RDW) 15.6 12.3 - 17.0 %     Platelet Count (PLT) 259 150 - 450 10*3/uL     Mean Platelet Volume (MPV) 8.3 6.8 - 10.2 fL     Neutrophils % 71 %     Lymphocytes % 20 %     Monocytes % 6 %     Eosinophils % 3 %     Basophils % 0 %     Neutrophils Absolute 8.90* 1.80 - 7.80 10*3/uL     Lymphocytes # 2.50 1.00 - 4.80 10*3/uL     Monocytes # 0.70 0.00 - 0.80 10*3/uL     Eosinophils # 0.40 0.00 - 0.50 10*3/uL     Basophils # 0.00 0.00 - 0.20 10*3/uL        My interpretation for patient's labs show that her white blood cell count is 12.55 with  a left shift, chemistries present to 71 urinalysis showed 500 glucose, 0-5 WBCs and 3-5 RBCs per high-power field.  CXR Impression: (Interpreted by me) None performed.  Impression of additional imaging studies include: None performed  Imaging: Radiology Results (last 72 hours)     ** No results found for the last 72 hours. **        Procedures   Procedures  Evidence Based Calculators      ED Course   ED Course as of 10/14/24 2106  Tue Oct 14, 2024  1952 Patient's initial vital signs are stable. [LT]  1952 Intervention-patient is on Keflex  for her UTI and has multiple allergies to oral medications and can only take IV ceftriaxone .  Patient's primary care doctor sent her to the ER because she has a persistent UTI on Keflex  and is not getting better.  Patient has had history of sepsis from a UTI in the past as well. [LT]  2101 Will try to get the patient with hospitalist at home to do a daily dose of IV or IM ceftriaxone  for the next 5 to 7 days but we were unable to get in touch with them at this hour.  We are just going to put in note to her primary care doctor to try to get him to set it up tomorrow for her.  Patient does not want to stay in the hospital.  Patient does not appear to be septic.  We gave her 2 g Rocephin  IV in the ER but her next dose would not be until tomorrow evening.  Patient will call her doctor tomorrow to see if she can have them set up her next injection. [LT]  2102 Disposition-discharge. [LT]    ED Course User Index [LT] Rock Dannielle Birmingham, MD    Medical Decision Making   External records were reviewed: Reviewed note from PCP office visit on 10/13/2024 which showed patient was seen for pyelonephritis, received 1g Rocephin  IM and advised to continue Keflex .  _________________________  Particia Slater Potters is a 43 y.o. female who presents to the ED with complaints of abdominal pressure, low back pain, dysuria, and decreased urination, onset ~6 days ago.  Patient reports she was initially prescribed Keflex  for a UTI on 10/08/2024 which she has been taking without relief to symptoms. She was then seen by PCP yesterday who administered 1g Rocephin  IM and advised the patient to go to the ED if there was no improvement in symptoms due to long list of allergies and urine culture testing positive for E.coli. Patient endorses history of UTI in 2020 that led to sepsis.. On my initial evaluation, patient is in no acute extremis.  The following differentials were considered:urolithiasis, urehtral stricture/diverticula, vaginitis, cysitis, pyelonephritis, vulvovaginitis, STDs, medication (PCN, topical hygiene, cytoxan), GU cancer, reiter syndrome, SLE, urethral trauma, UTI .    Pertinent studies were obtained, with results listed in chart above.  results interpreted as above.  Based on ED workup, findings are consistent with dysuria, acute UTI, multiple drug allergies.    Patient received 2 g IV Rocephin  in the ER.  On reevaluation, symptoms are improved.  Clinical Assessment/Plan: Patient presented to the ER because she grew E. coli on a culture from November 12, and she is still having dysuria and burning on Keflex .  Patient has multiple drug allergies to the point where they got back the culture result from November 12 and showed the cephalosporins but the primary care doctor felt like she was going to have to get IV ceftriaxone .  Patient was given 2 g of ceftriaxone  in the ER, we sent another urine culture, and her urinalysis itself actually looks like her urine infection is improving.  We tried to set her up with hospitalist at home to come in to do daily injections of ceftriaxone  for the next 5 to 7 days but were unsuccessful because there was no one here at night, and we have just told her to call her primary care doctor in the morning to see if they can set her up with daily injections of the ceftriaxone .  Patient is not having any nausea vomiting or fever  or evidence of sepsis and does not want to be admitted just for IV ceftriaxone   if they can do it as an outpatient and especially since it is only a one-time daily dose now.  Discussion of management or test interpretation with external provider(s): We attempted to reach hospitalist at home but they were unable to let anyone know tonight to do her medications.   Clinical Complexity/Risk   Patient's presentation is most consistent with acute presentation with potential threat to life or bodily function.  Patient's multiple allergies increases the complexity of managing their  presentation with UTI symptoms.    Pt presentation is complicated by their history of multiple antibiotic allergies, resulting in increased risk of severe infection  Provider time spent in patient care today, inclusive of but not limited to clinical reassessment, review of diagnostic studies, and discharge preparation, was greater than 30 minutes.  OTC medications: no, none Prescription medications discussed: no, continue current med  ED Clinical Impression   Diagnoses that have been ruled out:  None  Diagnoses that are still under consideration:  None  Final diagnoses:  Dysuria  Acute UTI  Drug allergy, multiple    ED Assessment/Plan   ED Disposition     ED Disposition  Discharge   Condition  Stable   Comment  --         DISCHARGE MEDICATIONS   Medication List     ASK your doctor about these medications    azelastine 137 mcg (0.1 %) nasal spray Commonly known as: ASTELIN Administer 1 spray into each nostril 2 (two) times a day.   buPROPion 300 mg 24 hr tablet Commonly known as: WELLBUTRIN XL Take 1 tab daily   busPIRone 15 mg tablet Commonly known as: BUSPAR Take 1/2 tab twice daily for a week, then 1 tab twice daily for anxiety   cephALEXin  500 mg capsule Commonly known as: KEFLEX  Take 500 mg by mouth 4 (four) times a day. for 10 days   cetirizine 10 mg tablet Commonly known  as: ZyrTEC Take 10 mg by mouth Once Daily.   chlorpheniramine-dextromethorp 2-15 mg/5 mL Liqd Take 10 mL by mouth every 4 (four) hours as needed (cought).   cyclobenzaprine  5 mg tablet Commonly known as: FLEXERIL  Take 1 tablet (5 mg total) by mouth 3 (three) times a day as needed for muscle spasms.   ergocalciferol 1,250 mcg (50,000 unit) capsule Commonly known as: VITAMIN D2 TAKE 1 CAPSULE BY MOUTH 1 TIME A WEEK   gabapentin 300 mg capsule Commonly known as: NEURONTIN Take 1 capsule (300 mg total) by mouth 3 (three) times a day.   * levothyroxine  200 mcg tablet Commonly known as: SYNTHROID  TAKE 1 TABLET(200 MCG) BY MOUTH BEFORE BREAKFAST   * levothyroxine  75 mcg tablet Commonly known as: SYNTHROID  TAKE 1/2 TABLET(37.5 MCG) BY MOUTH BEFORE BREAKFAST   losartan 25 mg tablet Commonly known as: COZAAR TAKE 1 TABLET(25 MG) BY MOUTH DAILY   metFORMIN 500 mg tablet Commonly known as: GLUCOPHAGE TAKE 2 TABLETS(1000 MG) BY MOUTH IN THE MORNING AND IN THE EVENING WITH MEALS   omega 3-dha-epa-fish oil 1,000 mg DR capsule Commonly known as: OMEGA 3 Take 1 capsule by mouth 2 (two) times a day.   omeprazole 40 mg DR capsule Commonly known as: PriLOSEC Take 1 capsule (40 mg total) by mouth daily before dinner.   ondansetron  4 mg tablet Commonly known as: ZOFRAN  Take 1 tablet (4 mg total) by mouth every 8 (eight) hours as needed for nausea or vomiting.   oxyCODONE -acetaminophen  5-325 mg per tablet Commonly known as: PERCOCET Take 1  tablet by mouth.   prazosin 2 mg capsule Commonly known as: MINIPRESS Take 2 capsules (4 mg total) by mouth at bedtime.   rosuvastatin 40 mg tablet Commonly known as: CRESTOR Take 1 tablet (40 mg total) by mouth daily.   tirzepatide 12.5 mg/0.5 mL subcutaneous pen injector Commonly known as: MOUNJARO Inject 0.5 mL (12.5 mg total) under the skin every 7 days.   * Ventolin  HFA 90 mcg/actuation inhaler Generic drug: albuterol  HFA Inhale 1 puff  every 6 (six) hours as needed for wheezing.   * albuterol  HFA 90 mcg/actuation inhaler Commonly known as: PROVENTIL  HFA;VENTOLIN  HFA;PROAIR  HFA Inhale 2 puffs every 6 (six) hours as needed for wheezing.      * * This list has 4 medication(s) that are the same as other medications prescribed for you. Read the directions carefully, and ask your doctor or other care provider to review them with you.           FOLLOW UP [facility], PA-C [address] [place] [address] [phone]   Call them tomorrow and try to get them to set you up to get a shot of Rocephin  every day for the next 5 to 7 days.   ____________________________ Scribe's Attestation: This document serves as a record of services personally performed by Rock Birmingham, MD. It was created on their behalf by Lavanda Pang, Scribe, a trained medical scribe. The creation of this record is the providers dictation and/or activities during the visit.   Electronically signed by: Rock Dannielle Birmingham, MD 10/14/2024 7:26 PM      *Some images could not be shown.

## 2024-10-16 NOTE — ED Provider Notes (Signed)
 Patient placed in First Look pathway, seen and evaluated for chief complaint of urinary issue  Pt was recently diagnosed with pyelonephritis.  Per notes, hospitalist at home was attempted to be set up so patient could receive daily IM rocephin  shot for 7 days but this has been unsuccessful.  Pt returns for further treatment.  Pertinent exam findings include NAD.   Patient/parent counseled on process, plan, and necessity for staying for completing the evaluation.      Note By: Peyton Gammons, PA-C 4:38 PM   [facility] Emergency Department Physician Note  ED Course - HPI and Medical Decision Making  Chief Complaint: Urinary Problem   History is obtained from Patient HPI: This is a 43 y.o. female who presents to the emergency department with complaints of urinary problem.  Patient was diagnosed with pyelonephritis 10/08/24 at [facility] ED, given a dose of rocephin  in the ED and discharged with keflex . Urine culture positive for E coli. Patient reports she saw her PCP Monday 11/17 for follow up, reported she was still experiencing vaginal pain and pressure, she was given 1g rocephin  IM in the office. Was told to present to ED if symptoms did not improve due to extensive antibiotics allergies. Patient did present to this ED 11/18 and was given 2 g rocephin  IV. She then went to an urgent care yesterday 11/19 for the same, and was given 1 g rocephin  IM. States her doctor called her today and told her to get another rocephin  injection at the ED, so she is back today for this. Denies fevers or vomiting. Endorses nausea, which she attributes to the antibiotics. Reports she is still taking the keflex  at home as well.   MDM and Assessment: -Differential diagnosis includes, but not limited to: urolithiasis, urehtral stricture/diverticula, vaginitis, cysitis, pyelonephritis, vulvovaginitis, STDs, urethral trauma, UTI   -Initial plan: Place patient on telemetry with continuous  pulse oximeter reading and vital signs per emergency protocol, CBC to evaluate cell lines for anemia, thrombocytopenia, leukocytosis, leukopenia, CMP to evaluate electrolyte derangements, renal function, liver function, and Urinalysis to evaluate for hematuria, infection, crystals  -Cardiac telemetry ordered for continuous cardiac and hemodynamic monitoring; reviewed telemetry showing Normal sinus rhythm without arrhythmia  -Patient presents with concerns for urinary tract infection, patient reports she been recently treated for a UTI and completed antibiotic therapy, went back and has been getting Rocephin  the last 2 days, sent to the emergency department for further Rocephin  - I have reviewed her recent urine cultures, 10/08/2024 she did have a positive culture for E. coli and was appropriately treated as it was pan susceptible, since then she has had 2 negative urine cultures both on 11/17-at this time she has no urinary symptoms, repeat urinalysis is negative ; I do not think she needs any more antibiotics and this was discussed with her.  She will be discharged    Medical Decision Making Problems Addressed: Encounter for general medical examination in adult: complicated acute illness or injury  Amount and/or Complexity of Data Reviewed External Data Reviewed: notes.    Details: Reviewed [place] visit from 10/08/24 for pyelonephritis. Reviewed internal med office visit from 10/13/24 for UTI sx, acute cystitis. Labs: ordered.    Factors Impacting ED Encounter Complexity: Increase Complexity: Discussion regarding prescription drug management  Impression  Patient's presentation is most consistent with acute presentation with potential threat to life or bodily function. 1. Encounter for general medical examination in adult    ED Disposition  ED Disposition  Discharge   Condition  Stable   Comment  --        Follow-up [facility], PA-C [address] [place] [address] [phone]     Review of Systems  A pertinent review of systems was obtained and was negative except as noted in the HPI and MDM. Physical Exam   ED Triage Vitals [10/16/24 1617]  Temp 97.9 F (36.6 C)  Heart Rate 87  Resp 20  BP 143/87  MAP (mmHg) 106  SpO2 95 %  O2 Device None (Room air)  O2 Flow Rate (L/min)   Weight 133 kg (294 lb)   Physical Exam  Constitutional Nursing notes reviewed Vital signs reviewed  HEENT No obvious trauma Supple without meningismus Pupils cross midline No scleral icterus or injection  Respiratory Effort normal CTAB No respiratory distress  CV Normal rate No pitting edema 2+ radial and DP pulses  Abdomen Soft Non-tender Non-distended No peritonitis  MSK Atraumatic No obvious deformity ROM appropriate  Skin Warm Dry  Neuro Awake and alert Pupils cross midline Moving all extremities  Psychiatric Mood and affect normal    Other pertinent exam findings per MDM-ED Course Procedures  Procedures  This document serves as a record of services personally performed by Dr. Merilee Maffucci, MD. It was created on their behalf by Chiquita DELENA Mirza, Scribe, a trained medical scribe. The creation of this record is the providers dictation and/or activities during the visit.   Electronically signed by: Chiquita DELENA Mirza, Scribe 10/16/2024 6:37 PM

## 2024-11-07 NOTE — Telephone Encounter (Signed)
 Copied from CRM #[id number]. Topic: Medication/Rx - Rx Refill >> Nov 07, 2024  9:30 AM Sharlet DEL wrote: Melissa Rollins, [place] is calling for medication request (Obtain: Medication Name, Dosage, Quantity, Frequency, Quantity Requested (example: 30-day supply.)   Include all details related to the request(s) below:  Patient has not taken the medication for over 4 month, she needs the lower dose, patient's insurance changed. Patient need medication tirzepatide (MOUNJARO).  Patient states the pharmacy will need prior authorization for medication icosapent ethyL 1 gram cap before they are able to fill it.  Confirm and type the Best Contact Number below:  Patient/caller contact number:         [phone]    [] Home  [x] Mobile  [] Work [] Other   [x] Okay to leave a voicemail   Medication List:  Current Outpatient Medications:    albuterol  HFA (PROVENTIL  HFA;VENTOLIN  HFA;PROAIR  HFA) 90 mcg/actuation inhaler, Inhale 2 puffs every 6 (six) hours as needed for wheezing. (Patient not taking: Reported on 10/15/2024), Disp: 1 each, Rfl: 11   albuterol  HFA (Ventolin  HFA) 90 mcg/actuation inhaler, Inhale 1 puff every 6 (six) hours as needed for wheezing., Disp: 18 g, Rfl: 0   azelastine (ASTELIN) 137 mcg (0.1 %) nasal spray, Administer 1 spray into each nostril 2 (two) times a day., Disp: 60 mL, Rfl: 1   buPROPion (WELLBUTRIN XL) 300 mg 24 hr tablet, Take 1 tab daily, Disp: 30 tablet, Rfl: 2   busPIRone (BUSPAR) 15 mg tablet, Take 1 tab twice daily for anxiety, Disp: 60 tablet, Rfl: 2   cetirizine (ZyrTEC) 10 mg tablet, Take 10 mg by mouth Once Daily., Disp: 30 tablet, Rfl: 0   chlorpheniramine-dextromethorp 2-15 mg/5 mL liqd, Take 10 mL by mouth every 4 (four) hours as needed (cought). (Patient not taking: Reported on 10/15/2024), Disp: 118 mL, Rfl: 0   cyclobenzaprine  (FLEXERIL ) 5 mg tablet, Take 1 tablet (5 mg total) by mouth 3 (three) times a day as needed for muscle spasms. (Patient not taking:  Reported on 10/15/2024), Disp: 30 tablet, Rfl: 0   ergocalciferol (VITAMIN D2) 1,250 mcg (50,000 unit) capsule, TAKE 1 CAPSULE BY MOUTH 1 TIME A WEEK, Disp: 12 capsule, Rfl: 3   gabapentin (NEURONTIN) 300 mg capsule, Take 1 capsule (300 mg total) by mouth 3 (three) times a day., Disp: 180 capsule, Rfl: 5   icosapent ethyL 1 gram cap, Take 2 g by mouth 2 (two) times a day., Disp: 360 capsule, Rfl: 3   levothyroxine  (SYNTHROID ) 200 mcg tablet, TAKE 1 TABLET(200 MCG) BY MOUTH BEFORE BREAKFAST, Disp: 90 tablet, Rfl: 1   levothyroxine  (SYNTHROID ) 75 mcg tablet, TAKE 1/2 TABLET(37.5 MCG) BY MOUTH BEFORE BREAKFAST, Disp: 45 tablet, Rfl: 1   losartan (COZAAR) 25 mg tablet, TAKE 1 TABLET(25 MG) BY MOUTH DAILY, Disp: 90 tablet, Rfl: 3   metFORMIN (GLUCOPHAGE) 500 mg tablet, TAKE 2 TABLETS(1000 MG) BY MOUTH IN THE MORNING AND IN THE EVENING WITH MEALS, Disp: 360 tablet, Rfl: 3   omega 3-dha-epa-fish oil (OMEGA 3) 1,000 mg DR capsule, Take 1 capsule by mouth 2 (two) times a day., Disp: , Rfl:    omeprazole (PriLOSEC) 40 mg DR capsule, Take 1 capsule (40 mg total) by mouth daily before dinner., Disp: 90 capsule, Rfl: 1   ondansetron  (ZOFRAN ) 4 mg tablet, Take 1 tablet (4 mg total) by mouth every 8 (eight) hours as needed for nausea or vomiting., Disp: 10 tablet, Rfl: 0   oxyCODONE -acetaminophen  (PERCOCET) 5-325 mg per tablet, Take 1 tablet by  mouth., Disp: , Rfl:    pioglitazone (Actos) 15 mg tablet, Take 1 tablet (15 mg total) by mouth daily., Disp: 30 tablet, Rfl: 5   prazosin (MINIPRESS) 2 mg capsule, Take 2 capsules (4 mg total) by mouth at bedtime., Disp: 180 capsule, Rfl: 3   rosuvastatin (CRESTOR) 40 mg tablet, Take 1 tablet (40 mg total) by mouth daily., Disp: 90 tablet, Rfl: 3   tirzepatide (MOUNJARO) 12.5 mg/0.5 mL subcutaneous pen injector, Inject 0.5 mL (12.5 mg total) under the skin every 7 days., Disp: 6 mL, Rfl: 0     Medication Request/Refills: Pharmacy Information (if  applicable)   [] Not Applicable       [x]  Pharmacy listed  Send Medication Request to:  [id number] - [place], [place] - [phone] AT [facility] [address] - PHONE: [phone] - FAX: [phone]                                                 [] Pharmacy not listed (added to pharmacy list in Epic) Send Medication Request to:      Listed Pharmacies: [id number] - [place], [place] - [address] AT [facility] [address] - PHONE: [phone] - FAX: [phone]

## 2024-11-24 ENCOUNTER — Encounter (HOSPITAL_BASED_OUTPATIENT_CLINIC_OR_DEPARTMENT_OTHER): Payer: Self-pay

## 2024-11-24 ENCOUNTER — Emergency Department (HOSPITAL_BASED_OUTPATIENT_CLINIC_OR_DEPARTMENT_OTHER)
Admission: EM | Admit: 2024-11-24 | Discharge: 2024-11-24 | Disposition: A | Discharge status: Home / Self Care | Attending: Emergency Medicine | Admitting: Emergency Medicine

## 2024-11-24 ENCOUNTER — Other Ambulatory Visit: Payer: Self-pay

## 2024-11-24 DIAGNOSIS — R197 Diarrhea, unspecified: Secondary | ICD-10-CM

## 2024-11-24 DIAGNOSIS — J45909 Unspecified asthma, uncomplicated: Secondary | ICD-10-CM | POA: Insufficient documentation

## 2024-11-24 DIAGNOSIS — E860 Dehydration: Secondary | ICD-10-CM | POA: Diagnosis not present

## 2024-11-24 DIAGNOSIS — E119 Type 2 diabetes mellitus without complications: Secondary | ICD-10-CM | POA: Diagnosis not present

## 2024-11-24 DIAGNOSIS — D72829 Elevated white blood cell count, unspecified: Secondary | ICD-10-CM | POA: Insufficient documentation

## 2024-11-24 DIAGNOSIS — I10 Essential (primary) hypertension: Secondary | ICD-10-CM | POA: Insufficient documentation

## 2024-11-24 LAB — COMPREHENSIVE METABOLIC PANEL WITH GFR
ALT: 22 U/L (ref 0–44)
AST: 59 U/L — ABNORMAL HIGH (ref 15–41)
Albumin: 4.5 g/dL (ref 3.5–5.0)
Alkaline Phosphatase: 155 U/L — ABNORMAL HIGH (ref 38–126)
Anion gap: 18 — ABNORMAL HIGH (ref 5–15)
BUN: 10 mg/dL (ref 6–20)
CO2: 20 mmol/L — ABNORMAL LOW (ref 22–32)
Calcium: 9.5 mg/dL (ref 8.9–10.3)
Chloride: 99 mmol/L (ref 98–111)
Creatinine, Ser: 0.86 mg/dL (ref 0.44–1.00)
GFR, Estimated: >60 mL/min
Glucose, Bld: 199 mg/dL — ABNORMAL HIGH (ref 70–99)
Potassium: 3.7 mmol/L (ref 3.5–5.1)
Sodium: 136 mmol/L (ref 135–145)
Total Bilirubin: 0.5 mg/dL (ref 0.0–1.2)
Total Protein: 7.8 g/dL (ref 6.5–8.1)

## 2024-11-24 LAB — LIPASE, BLOOD: Lipase: 39 U/L (ref 11–51)

## 2024-11-24 LAB — CBC
HCT: 39.7 % (ref 36.0–46.0)
Hemoglobin: 13.3 g/dL (ref 12.0–15.0)
MCH: 28.7 pg (ref 26.0–34.0)
MCHC: 33.5 g/dL (ref 30.0–36.0)
MCV: 85.6 fL (ref 80.0–100.0)
Platelets: 216 K/uL (ref 150–400)
RBC: 4.64 MIL/uL (ref 3.87–5.11)
RDW: 15.9 % — ABNORMAL HIGH (ref 11.5–15.5)
WBC: 12.7 K/uL — ABNORMAL HIGH (ref 4.0–10.5)
nRBC: 0 % (ref 0.0–0.2)

## 2024-11-24 LAB — MAGNESIUM: Magnesium: 1.9 mg/dL (ref 1.7–2.4)

## 2024-11-24 MED ORDER — LOPERAMIDE HCL 2 MG PO CAPS: 2.0000 mg | ORAL_CAPSULE | Freq: Four times a day (QID) | ORAL | 0 refills | Status: AC | PRN

## 2024-11-24 MED ORDER — LOPERAMIDE HCL 2 MG PO CAPS
2.0000 mg | ORAL_CAPSULE | Freq: Once | ORAL | Status: AC
  Administered 2024-11-24: 2 mg via ORAL
  Filled 2024-11-24: qty 1

## 2024-11-24 MED ORDER — ONDANSETRON 4 MG PO TBDP: 4.0000 mg | ORAL_TABLET | Freq: Three times a day (TID) | ORAL | 0 refills | Status: AC | PRN

## 2024-11-24 MED ORDER — SODIUM CHLORIDE 0.9 % IV BOLUS
1000.0000 mL | Freq: Once | INTRAVENOUS | Status: AC
  Administered 2024-11-24: 1000 mL via INTRAVENOUS

## 2024-11-24 MED ORDER — ONDANSETRON 4 MG PO TBDP
4.0000 mg | ORAL_TABLET | Freq: Once | ORAL | Status: AC
  Administered 2024-11-24: 4 mg via ORAL
  Filled 2024-11-24: qty 1

## 2024-11-24 NOTE — ED Triage Notes (Signed)
 Pt. Reports since wed. After eating and drinking she keeps having diarrhea.pt. reports diarrhea episodes 9x in last 8 hours. Now having muscle cramps, nausea, and heaviness in bilat arms. Severe abd cramping. Pain is worse with bowel movement

## 2024-11-24 NOTE — ED Provider Notes (Signed)
 "  Emergency Department Provider Note   I have reviewed the triage vital signs and the nursing notes.   HISTORY  Chief Complaint Diarrhea   HPI Melissa Rollins is a 43 y.o. female past history of diabetes, hypertension, asthma presents to the emergency department with frequent watery diarrhea.  She is having diarrhea episodes almost hourly in the past 8 hours.  She has had symptoms since Wednesday.  She describes cramping, diffuse abdominal pain without focal pain symptoms.  No cough, congestion, flu symptoms.  No fevers.  No vomiting. No rash. Patient with UTI treatment in mid-November with abx then but nothing since. No travel.   Past Medical History:  Diagnosis Date   Asthma    Diabetes mellitus without complication (HCC)    Hypercholesterolemia    Hypertension    Morbid obesity (HCC)    PTSD (post-traumatic stress disorder)    Seasonal allergies    Thyroid disease     Review of Systems  Constitutional: No fever/chills Cardiovascular: Denies chest pain. Respiratory: Denies shortness of breath. Gastrointestinal: Positive cramping abdominal pain.  No nausea, no vomiting. Positive diarrhea.  No constipation. Genitourinary: Negative for dysuria. Musculoskeletal: Negative for back pain. Skin: Negative for rash. Neurological: Negative for headaches.  ____________________________________________   PHYSICAL EXAM:  VITAL SIGNS: ED Triage Vitals  Encounter Vitals Group     BP 11/24/24 0340 119/68     Pulse Rate 11/24/24 0340 95     Resp 11/24/24 0340 18     Temp 11/24/24 0340 98 F (36.7 C)     Temp src --      SpO2 11/24/24 0340 93 %   Constitutional: Alert and oriented. Well appearing and in no acute distress. Eyes: Conjunctivae are normal.  Head: Atraumatic. Nose: No congestion/rhinnorhea. Mouth/Throat: Mucous membranes are moist.  Neck: No stridor.   Cardiovascular: Normal rate, regular rhythm. Good peripheral circulation. Grossly normal heart sounds.    Respiratory: Normal respiratory effort.  No retractions. Lungs CTAB. Gastrointestinal: Soft and nontender. No distention.  Musculoskeletal: No gross deformities of extremities. Neurologic:  Normal speech and language.  Skin:  Skin is warm, dry and intact. No rash noted.   ____________________________________________   LABS (all labs ordered are listed, but only abnormal results are displayed)  Labs Reviewed  COMPREHENSIVE METABOLIC PANEL WITH GFR - Abnormal; Notable for the following components:      Result Value   CO2 20 (*)    Glucose, Bld 199 (*)    AST 59 (*)    Alkaline Phosphatase 155 (*)    Anion gap 18 (*)    All other components within normal limits  CBC - Abnormal; Notable for the following components:   WBC 12.7 (*)    RDW 15.9 (*)    All other components within normal limits  C DIFFICILE QUICK SCREEN W PCR REFLEX    LIPASE, BLOOD  MAGNESIUM  URINALYSIS, ROUTINE W REFLEX MICROSCOPIC    ____________________________________________   PROCEDURES  Procedure(s) performed:   Procedures  None  ____________________________________________   INITIAL IMPRESSION / ASSESSMENT AND PLAN / ED COURSE  Pertinent labs & imaging results that were available during my care of the patient were reviewed by me and considered in my medical decision making (see chart for details).   This patient is Presenting for Evaluation of diarrhea, which does require a range of treatment options, and is a complaint that involves a moderate risk of morbidity and mortality.  The Differential Diagnoses includes but is not exclusive to  acute cholecystitis, intrathoracic causes for epigastric abdominal pain, gastritis, duodenitis, pancreatitis, small bowel or large bowel obstruction, abdominal aortic aneurysm, hernia, gastritis, etc.   Critical Interventions-    Medications  loperamide  (IMODIUM ) capsule 2 mg (has no administration in time range)  ondansetron  (ZOFRAN -ODT) disintegrating  tablet 4 mg (has no administration in time range)  sodium chloride  0.9 % bolus 1,000 mL (0 mLs Intravenous Stopped 11/24/24 0510)    Reassessment after intervention: pain symptoms improved.   Clinical Laboratory Tests Ordered, included mild leukocytosis to 12.7.  No acute kidney injury.  Potassium normal.  Magnesium normal. Unable to provide stool sample for C diff.   Radiologic Tests: Considered CT abdomen pelvis but no focal tenderness and vital signs are unremarkable.  Defer imaging for now.  Cardiac Monitor Tracing which shows NSR.    Social Determinants of Health Risk patient has a smoking history.   Consult complete with  Medical Decision Making: Summary:  Patient presents to the emergency department with cramping abdominal pain and diarrhea.  Slight gap acidosis likely related to acute dehydration rather than sepsis.  No evidence of DKA. Plan for IVF and reassess.   Reevaluation with update and discussion with patient. She is feeling improved after IVF. No tachycardia. Plan for immodium and zofran  with close PCP follow up plan. Unable to provide a stool sample here for testing.   Patient's presentation is most consistent with acute presentation with potential threat to life or bodily function.   Disposition: discharge  ____________________________________________  FINAL CLINICAL IMPRESSION(S) / ED DIAGNOSES  Final diagnoses:  Diarrhea, unspecified type  Dehydration     NEW OUTPATIENT MEDICATIONS STARTED DURING THIS VISIT:  Current Discharge Medication List     START taking these medications   Details  loperamide  (IMODIUM ) 2 MG capsule Take 1 capsule (2 mg total) by mouth 4 (four) times daily as needed for diarrhea or loose stools. Qty: 12 capsule, Refills: 0        Note:  This document was prepared using Dragon voice recognition software and may include unintentional dictation errors.  Fonda Law, MD, [facility]    Finlee Concepcion, Fonda MATSU,  MD 11/24/24 [phone]  "

## 2024-11-24 NOTE — Discharge Instructions (Signed)
 Please stay hydrated at home and call your PCP this morning for close follow up. Return with worsening abdominal pain or fever.

## 2024-12-29 ENCOUNTER — Other Ambulatory Visit (HOSPITAL_BASED_OUTPATIENT_CLINIC_OR_DEPARTMENT_OTHER): Payer: Self-pay
